# Patient Record
Sex: Male | Born: 1945 | Race: White | Hispanic: No | Marital: Married | Smoking: Former smoker
Health system: Southern US, Community
[De-identification: ages and names within clinical notes are randomized; demographics above are authoritative.]

## PROBLEM LIST (undated history)

## (undated) DIAGNOSIS — I519 Heart disease, unspecified: Secondary | ICD-10-CM

## (undated) DIAGNOSIS — E7800 Pure hypercholesterolemia, unspecified: Secondary | ICD-10-CM

## (undated) DIAGNOSIS — I10 Essential (primary) hypertension: Secondary | ICD-10-CM

## (undated) HISTORY — PX: CORONARY STENT PLACEMENT: SHX1402

## (undated) HISTORY — DX: Pure hypercholesterolemia, unspecified: E78.00

## (undated) HISTORY — DX: Heart disease, unspecified: I51.9

---

## 2015-09-19 ENCOUNTER — Emergency Department (HOSPITAL_COMMUNITY): Payer: Medicare PPO

## 2015-09-19 ENCOUNTER — Encounter (HOSPITAL_COMMUNITY): Payer: Self-pay | Admitting: Emergency Medicine

## 2015-09-19 ENCOUNTER — Observation Stay (HOSPITAL_COMMUNITY): Payer: Medicare PPO

## 2015-09-19 ENCOUNTER — Observation Stay (HOSPITAL_COMMUNITY)
Admission: EM | Admit: 2015-09-19 | Discharge: 2015-09-20 | Disposition: A | Discharge status: Home / Self Care | Payer: Medicare PPO | Attending: Internal Medicine | Admitting: Internal Medicine

## 2015-09-19 DIAGNOSIS — I6200 Nontraumatic subdural hemorrhage, unspecified: Principal | ICD-10-CM | POA: Insufficient documentation

## 2015-09-19 DIAGNOSIS — R262 Difficulty in walking, not elsewhere classified: Secondary | ICD-10-CM | POA: Diagnosis not present

## 2015-09-19 DIAGNOSIS — I2510 Atherosclerotic heart disease of native coronary artery without angina pectoris: Secondary | ICD-10-CM | POA: Diagnosis not present

## 2015-09-19 DIAGNOSIS — Z7982 Long term (current) use of aspirin: Secondary | ICD-10-CM | POA: Diagnosis not present

## 2015-09-19 DIAGNOSIS — D696 Thrombocytopenia, unspecified: Secondary | ICD-10-CM | POA: Diagnosis present

## 2015-09-19 DIAGNOSIS — R569 Unspecified convulsions: Secondary | ICD-10-CM

## 2015-09-19 DIAGNOSIS — G454 Transient global amnesia: Secondary | ICD-10-CM

## 2015-09-19 DIAGNOSIS — Z87891 Personal history of nicotine dependence: Secondary | ICD-10-CM | POA: Diagnosis not present

## 2015-09-19 DIAGNOSIS — S065XAA Traumatic subdural hemorrhage with loss of consciousness status unknown, initial encounter: Secondary | ICD-10-CM | POA: Insufficient documentation

## 2015-09-19 DIAGNOSIS — Y9321 Activity, ice skating: Secondary | ICD-10-CM | POA: Insufficient documentation

## 2015-09-19 DIAGNOSIS — Z955 Presence of coronary angioplasty implant and graft: Secondary | ICD-10-CM | POA: Diagnosis not present

## 2015-09-19 DIAGNOSIS — R413 Other amnesia: Secondary | ICD-10-CM | POA: Diagnosis present

## 2015-09-19 DIAGNOSIS — I10 Essential (primary) hypertension: Secondary | ICD-10-CM | POA: Insufficient documentation

## 2015-09-19 DIAGNOSIS — S065X9A Traumatic subdural hemorrhage with loss of consciousness of unspecified duration, initial encounter: Secondary | ICD-10-CM

## 2015-09-19 DIAGNOSIS — W19XXXA Unspecified fall, initial encounter: Secondary | ICD-10-CM

## 2015-09-19 DIAGNOSIS — E785 Hyperlipidemia, unspecified: Secondary | ICD-10-CM | POA: Insufficient documentation

## 2015-09-19 HISTORY — DX: Essential (primary) hypertension: I10

## 2015-09-19 LAB — URINALYSIS, ROUTINE W REFLEX MICROSCOPIC
Bilirubin Urine: NEGATIVE
Glucose, UA: NEGATIVE mg/dL
Hgb urine dipstick: NEGATIVE
KETONES UR: 15 mg/dL — AB
LEUKOCYTES UA: NEGATIVE
NITRITE: NEGATIVE
PH: 6 (ref 5.0–8.0)
Protein, ur: NEGATIVE mg/dL
Specific Gravity, Urine: 1.017 (ref 1.005–1.030)
UROBILINOGEN UA: 0.2 mg/dL (ref 0.0–1.0)

## 2015-09-19 LAB — RAPID URINE DRUG SCREEN, HOSP PERFORMED
Amphetamines: NOT DETECTED
BARBITURATES: NOT DETECTED
Benzodiazepines: NOT DETECTED
COCAINE: NOT DETECTED
OPIATES: NOT DETECTED
Tetrahydrocannabinol: NOT DETECTED

## 2015-09-19 LAB — CBC
HCT: 40.1 % (ref 39.0–52.0)
HEMOGLOBIN: 14.2 g/dL (ref 13.0–17.0)
MCH: 31.2 pg (ref 26.0–34.0)
MCHC: 35.4 g/dL (ref 30.0–36.0)
MCV: 88.1 fL (ref 78.0–100.0)
Platelets: 128 10*3/uL — ABNORMAL LOW (ref 150–400)
RBC: 4.55 MIL/uL (ref 4.22–5.81)
RDW: 12.7 % (ref 11.5–15.5)
WBC: 6.6 10*3/uL (ref 4.0–10.5)

## 2015-09-19 LAB — COMPREHENSIVE METABOLIC PANEL
ALT: 22 U/L (ref 17–63)
ANION GAP: 9 (ref 5–15)
AST: 31 U/L (ref 15–41)
Albumin: 4.1 g/dL (ref 3.5–5.0)
Alkaline Phosphatase: 44 U/L (ref 38–126)
BUN: 15 mg/dL (ref 6–20)
CHLORIDE: 102 mmol/L (ref 101–111)
CO2: 26 mmol/L (ref 22–32)
Calcium: 9 mg/dL (ref 8.9–10.3)
Creatinine, Ser: 0.91 mg/dL (ref 0.61–1.24)
GFR calc non Af Amer: >60 mL/min (ref >60)
Glucose, Bld: 91 mg/dL (ref 65–99)
Potassium: 3.8 mmol/L (ref 3.5–5.1)
SODIUM: 137 mmol/L (ref 135–145)
Total Bilirubin: 1.1 mg/dL (ref 0.3–1.2)
Total Protein: 6.4 g/dL — ABNORMAL LOW (ref 6.5–8.1)

## 2015-09-19 LAB — ETHANOL: Alcohol, Ethyl (B): <5 mg/dL (ref <5)

## 2015-09-19 LAB — I-STAT TROPONIN, ED: TROPONIN I, POC: 0 ng/mL (ref 0.00–0.08)

## 2015-09-19 LAB — PROTIME-INR
INR: 1.06 (ref 0.00–1.49)
Prothrombin Time: 14 seconds (ref 11.6–15.2)

## 2015-09-19 LAB — CBG MONITORING, ED: GLUCOSE-CAPILLARY: 85 mg/dL (ref 65–99)

## 2015-09-19 MED ORDER — DOXAZOSIN MESYLATE 8 MG PO TABS
4.0000 mg | ORAL_TABLET | Freq: Two times a day (BID) | ORAL | Status: DC
Start: 2015-09-20 — End: 2015-09-20
  Administered 2015-09-20: 4 mg via ORAL
  Filled 2015-09-19: qty 1

## 2015-09-19 MED ORDER — ACETAMINOPHEN 325 MG PO TABS: 650.0000 mg | ORAL_TABLET | ORAL | Status: DC | PRN

## 2015-09-19 MED ORDER — ATORVASTATIN CALCIUM 40 MG PO TABS
40.0000 mg | ORAL_TABLET | Freq: Every day | ORAL | Status: DC
  Administered 2015-09-19 – 2015-09-20 (×2): 40 mg via ORAL
  Filled 2015-09-19 (×2): qty 1

## 2015-09-19 MED ORDER — STROKE: EARLY STAGES OF RECOVERY BOOK
Freq: Once | Status: AC
  Administered 2015-09-19: 23:00:00
  Filled 2015-09-19: qty 1

## 2015-09-19 MED ORDER — FLUTICASONE PROPIONATE 50 MCG/ACT NA SUSP
2.0000 | Freq: Every day | NASAL | Status: DC
  Administered 2015-09-19 – 2015-09-20 (×2): 2 via NASAL
  Filled 2015-09-19: qty 16

## 2015-09-19 MED ORDER — LISINOPRIL-HYDROCHLOROTHIAZIDE 20-12.5 MG PO TABS
1.0000 | ORAL_TABLET | Freq: Every day | ORAL | Status: DC
  Filled 2015-09-19: qty 1

## 2015-09-19 MED ORDER — SENNOSIDES-DOCUSATE SODIUM 8.6-50 MG PO TABS
1.0000 | ORAL_TABLET | Freq: Two times a day (BID) | ORAL | Status: DC
  Administered 2015-09-19 – 2015-09-20 (×2): 1 via ORAL
  Filled 2015-09-19 (×2): qty 1

## 2015-09-19 MED ORDER — PANTOPRAZOLE SODIUM 40 MG IV SOLR
40.0000 mg | Freq: Every day | INTRAVENOUS | Status: DC
  Administered 2015-09-19: 40 mg via INTRAVENOUS
  Filled 2015-09-19: qty 40

## 2015-09-19 MED ORDER — ACETAMINOPHEN 650 MG RE SUPP: 650.0000 mg | RECTAL | Status: DC | PRN

## 2015-09-19 NOTE — ED Provider Notes (Signed)
CSN: [id number]     Arrival date & time 09/19/15  1648 History   First MD Initiated Contact with Patient 09/19/15 1723     Chief Complaint  Patient presents with  . Altered Mental Status     (Consider location/radiation/quality/duration/timing/severity/associated sxs/prior Treatment) HPI   Patient is a 69 year old male with history of  CAD presenting today with altered mental status. Patient doesn't remember at all what happened today. Wife helped fill in the blanks with the help of EMS run report.  Patient went to go ice skating she's been doing for the last month. Apparently he was confused and called his wife. She told him to go in and tells someone that he was confused. Reportedly he was found wandering around the parking lot of the ice-skating place.  Patient has no memory of what happened at all. Patient doesn't know what month it is. Patient is able to tell me his name and birthday. Patient aware of who his wife is.  Denies any trauma. Denies any ethanol denies any drug use. Denies any recent  medication changes.  Past Medical History  Diagnosis Date  . Hypertension    Past Surgical History  Procedure Laterality Date  . Coronary stent placement     History reviewed. No pertinent family history. Social History  Substance Use Topics  . Smoking status: Former Games developer  . Smokeless tobacco: Never Used  . Alcohol Use: Yes     Comment: occ    Review of Systems  Constitutional: Negative for fever and activity change.  HENT: Negative for drooling and hearing loss.   Eyes: Negative for discharge and redness.  Respiratory: Negative for cough and shortness of breath.   Cardiovascular: Negative for chest pain.  Gastrointestinal: Negative for abdominal pain.  Genitourinary: Negative for dysuria and urgency.  Musculoskeletal: Negative for arthralgias.  Allergic/Immunologic: Negative for immunocompromised state.  Neurological: Negative for dizziness, seizures, speech difficulty  and headaches.  Psychiatric/Behavioral: Positive for confusion.  All other systems reviewed and are negative.     Allergies  Review of patient's allergies indicates not on file.  Home Medications   Prior to Admission medications   Not on File   BP 125/72 mmHg  Pulse 75  Temp(Src) 98.1 F (36.7 C) (Oral)  Resp 12  SpO2 99% Physical Exam  Constitutional: He is oriented to person, place, and time. He appears well-nourished.  HENT:  Head: Normocephalic.  Mouth/Throat: Oropharynx is clear and moist.  Eyes: Conjunctivae are normal.  Neck: No tracheal deviation present.  Cardiovascular: Normal rate.   Pulmonary/Chest: Effort normal. No stridor. No respiratory distress.  Abdominal: Soft. There is no tenderness. There is no guarding.  Musculoskeletal: Normal range of motion. He exhibits no edema.  Neurological: He is oriented to person, place, and time. No cranial nerve deficit.  Patient has remote memory intact. However, remember anything from today. Cranial nerves otherwise normal. No pronator drift no other symptoms. His only neurologic symptoms inability to remember the month today.  Skin: Skin is warm and dry. No rash noted. He is not diaphoretic.  Psychiatric: He has a normal mood and affect. His behavior is normal.  Nursing note and vitals reviewed.   ED Course  Procedures (including critical care time) Labs Review Labs Reviewed  CBC  COMPREHENSIVE METABOLIC PANEL  PROTIME-INR  ETHANOL  URINE RAPID DRUG SCREEN, HOSP PERFORMED  URINALYSIS, ROUTINE W REFLEX MICROSCOPIC (NOT AT [facility])  CBG MONITORING, ED  I-STAT TROPOININ, ED    Imaging Review No results found.  I have personally reviewed and evaluated these images and lab results as part of my medical decision-making.   EKG Interpretation   Date/Time:  Thursday September 19 2015 17:01:37 EDT Ventricular Rate:  71 PR Interval:  190 QRS Duration: 97 QT Interval:  396 QTC Calculation: 430 R Axis:   7 Text  Interpretation:  Sinus rhythm RSR' in V1 or V2, probably normal  variant Borderline T abnormalities, inferior leads Minimal ST elevation,  anterior leads no acute ischemia T wave inversion in V3 Confirmed by  [place], COURTNEY ([id number]) on 09/19/2015 5:30:24 PM      MDM   Final diagnoses:  None     patient is a 69 year old male presenting with altered mental status. Patient seems to have isolated recent memory loss. He had confusion and no memories from the entire day. We'll get CT brain to make sure he does not have hemorrhagic bleed. Will get ethanol and drug screen.  Concern for TGA versus ischemic stroke versus hemorrhagic stroke worse Versus med effect.  We'll get CT head, labs and consult neurology.  6:39 PM Neurology agrees, likely TGA will need inpatient MRI and EEG with neuro following.     Courteney Randall An, MD 09/19/15 [phone]

## 2015-09-19 NOTE — ED Notes (Signed)
Pt continues to have memory issues.  Pt able to recall his favorite activity (ice hockey), but still unable to recall the month, which this RN has given to him twice since his arrival.  Pt knows what year, but does not feel that it is "late enough in the year" for it to be September.

## 2015-09-19 NOTE — ED Notes (Signed)
Followed up with CT for ETA.  States patient is "on the list"

## 2015-09-19 NOTE — H&P (Signed)
[facility] History and Physical  Patient: Dennis Short  MRN: [id number]  DOB: 1946/04/09  DOS: the patient was seen and examined on 09/19/2015 PCP: Dennis Kidney, MD  Referring physician: Dr. Elza Rafter Chief Complaint: Confusion  HPI: Dennis Short is a 69 y.o. male with Past medical history of coronary artery disease, essential hypertension, dyslipidemia, thrombocytopenia. The patient is presenting with complaints of confusion. Patient was found by EMS as he was seen by staff at an ice rink fumbling. Patient is unable to remember any events. Patient denies having any complaints of headache, dizziness, lightheadedness, vertigo, vision changes, speech changes, nausea, vomiting, abdominal pain, chest pain, diarrhea, constipation, burning urination, focal deficit. He denies any changes in his medication. He denies having any fall trauma or injury today. He mentions he had a fall a week ago. He denies having any alcohol or drugs. Patient did have a PCI in [place]. [facility] with drug-eluting stent in unspecified location.  The patient is coming from home.  At his baseline ambulates without support And is independent for most of his ADL; manages his medication on his own.  Review of Systems: as mentioned in the history of present illness.  A comprehensive review of the other systems is negative.  Past Medical History  Diagnosis Date  . Hypertension    Past Surgical History  Procedure Laterality Date  . Coronary stent placement     Social History:  reports that he has quit smoking. He has never used smokeless tobacco. He reports that he drinks alcohol. He reports that he does not use illicit drugs.  Allergies  Allergen Reactions  . Codeine Other (See Comments)    'made me feel bad'    History reviewed. No pertinent family history.  Prior to Admission medications   Medication Sig Start Date End Date Taking? Authorizing Provider  aspirin EC 81 MG tablet Take 81  mg by mouth daily.   Yes Historical Provider, MD  atorvastatin (LIPITOR) 40 MG tablet Take 40 mg by mouth daily. 07/02/15  Yes Historical Provider, MD  doxazosin (CARDURA) 4 MG tablet Take 4 mg by mouth 2 (two) times daily. 07/11/15  Yes Historical Provider, MD  EFFIENT 10 MG TABS tablet Take 10 mg by mouth daily. 07/12/15  Yes Historical Provider, MD  fluticasone (FLONASE) 50 MCG/ACT nasal spray 2 SPRAYS EACH NOSTRIL IN THE MORNING 08/19/15  Yes Historical Provider, MD  KLOR-CON M10 10 MEQ tablet Take 10 mEq by mouth 2 (two) times daily. 06/17/15  Yes Historical Provider, MD  lisinopril-hydrochlorothiazide (PRINZIDE,ZESTORETIC) 20-12.5 MG tablet Take 1 tablet by mouth daily. 07/01/15  Yes Historical Provider, MD  NITROSTAT 0.4 MG SL tablet PLACE ONE TABLET (0.4 MG TOTAL) UNDER THE TONGUE EVERY 5 (FIVE) MINUTES AS NEEDED FOR CHEST PAIN. 06/25/15  Yes Historical Provider, MD    Physical Exam: Filed Vitals:   09/19/15 2005 09/19/15 2015 09/19/15 2030 09/19/15 2100  BP:  123/64 118/58 130/70  Pulse:  70 68 63  Temp: 97.9 F (36.6 C)     TempSrc:      Resp:  14 11   SpO2:  99% 99% 99%    General: Alert, Awake and Oriented to Time, Place and Person. Appear in mild distress Eyes: PERRL ENT: Oral Mucosa clear moist Neck: no JVD, possible lipoma on the back of the neck Cardiovascular: S1 and S2 Present, no Murmur, Peripheral Pulses Present Respiratory: Bilateral Air entry equal and Decreased,  Clear to Auscultation, no Crackles, no wheezes Abdomen: Bowel Sound present, Soft  and no tenderness Skin: no Rash Extremities: no Pedal edema, no calf tenderness Neurologic: Grossly no focal neuro deficit other than memory  Labs on Admission:  CBC:  Recent Labs Lab 09/19/15 1811  WBC 6.6  HGB 14.2  HCT 40.1  MCV 88.1  PLT 128*    CMP     Component Value Date/Time   NA 137 09/19/2015 1811   K 3.8 09/19/2015 1811   CL 102 09/19/2015 1811   CO2 26 09/19/2015 1811   GLUCOSE 91 09/19/2015 1811     BUN 15 09/19/2015 1811   CREATININE 0.91 09/19/2015 1811   CALCIUM 9.0 09/19/2015 1811   PROT 6.4* 09/19/2015 1811   ALBUMIN 4.1 09/19/2015 1811   AST 31 09/19/2015 1811   ALT 22 09/19/2015 1811   ALKPHOS 44 09/19/2015 1811   BILITOT 1.1 09/19/2015 1811   GFRNONAA >60 09/19/2015 1811   GFRAA >60 09/19/2015 1811    No results for input(s): CKTOTAL, CKMB, CKMBINDEX, TROPONINI in the last 168 hours. BNP (last 3 results) No results for input(s): BNP in the last 8760 hours.  ProBNP (last 3 results) No results for input(s): PROBNP in the last 8760 hours.   Radiological Exams on Admission: Ct Head Wo Contrast  09/19/2015   CLINICAL DATA:  Recent fall and altered mental status  EXAM: CT HEAD WITHOUT CONTRAST  TECHNIQUE: Contiguous axial images were obtained from the base of the skull through the vertex without intravenous contrast.  COMPARISON:  None.  FINDINGS: Bony calvarium is intact. Mild thickening of the falx is noted. This would be consistent with a small falx subdural hematoma. No subarachnoid hemorrhage is identified at this time. No acute infarct is seen.  IMPRESSION: Mild subdural hematoma along the falx cerebri predominately to the left of the midline. No significant mass-effect is seen. No other focal abnormality is noted.  Critical Value/emergent results were called by telephone at the time of interpretation on 09/19/2015 at 7:36 pm to Dr. Bary Castilla , who verbally acknowledged these results.   Electronically Signed   By: Alcide Clever M.D.   On: 09/19/2015 19:35   Ct Cervical Spine Wo Contrast  09/19/2015   CLINICAL DATA:  Fall today with posterior neck stiffness. Amnesia. Initial encounter.  EXAM: CT CERVICAL SPINE WITHOUT CONTRAST  TECHNIQUE: Multidetector CT imaging of the cervical spine was performed without intravenous contrast. Multiplanar CT image reconstructions were also generated.  COMPARISON:  Head CT 09/19/2015.  FINDINGS: The cervical alignment is normal. There  is no evidence of acute fracture or traumatic subluxation.  There is multilevel spondylosis with disc space loss and uncinate spurring greatest at C5-6 and C6-7. There is mild resulting foraminal narrowing.  No acute soft tissue findings are demonstrated within the neck. There is a subcutaneous lipoma posteriorly in the upper right neck, measuring up to 4 cm transverse. Carotid calcifications are present bilaterally. There is a calcified granuloma at the left lung apex.  IMPRESSION: 1. No evidence of acute cervical spine fracture, traumatic subluxation or static signs of instability. 2. Cervical spondylosis. 3. Subcutaneous lipoma posteriorly in the upper right neck.   Electronically Signed   By: Carey Bullocks M.D.   On: 09/19/2015 21:27   EKG: Independently reviewed. normal sinus rhythm, nonspecific ST and T waves changes.  Assessment/Plan 1. SDH (subdural hematoma)  2 Amnesia The patient is presenting with complaints of confusion. CT scan is positive for mild subdural hematoma without any mass effect. Neurology has a winter the patient and recommend an  EEG as well as MRI brain. At present we do not have any information whether the patient had a head injury or not. Currently holding aspirin and Effient, patient has already taken the dose for 09/19/2015. Patient will need a cardiology consultation in the morning to discuss further regarding continuation of aspirin and Effient. Neuro-surgery has been consulted by ER who recommends conservative management.  3  CAD (coronary artery disease), PCI June 2016 Xience, [facility] [place] Patient did have recent coronary artery intervention, and is on dual antiplatelets therapy. Will need cardiology consultation regarding continuation of dual antiplatelets In the setting of ongoing subdural hematoma.  4  Essential hypertension Continue home medications.  5  Dyslipidemia Continue home medication.  6  Thrombocytopenia etiology unclear  but I do not have any prior values to compare. Currently we will closely monitor.  Nutrition: Regular cardiac diet  DVT Prophylaxis:mechanical compression device  Advance goals of care discussion: Full code   Consults: Neurology neurosurgery  Family Communication: family was present at bedside, opportunity was given to ask question and all questions were answered satisfactorily at the time of interview. Disposition: Admitted as observation, telemetry unit.  Author: Lynden Oxford, MD [facility] Pager: [phone] 09/19/2015  If 7PM-7AM, please contact night-coverage [email or web address] Password [email or web address]

## 2015-09-19 NOTE — ED Notes (Signed)
Attempted report x1. 

## 2015-09-19 NOTE — Consult Note (Signed)
Neurology Consultation Reason for Consult: Memory problems Referring Physician: Corlis Leak, C  CC: Memory problems  History is obtained from: Patient, wife  HPI: Dennis Short is a 69 y.o. male with a history of hypertension who presents with memory problems started around noon. He went to the ice rink to do some skating, and while there he called his wife. His wife states that he was saying the same thing over and over again, and was telling her that something was not right.  The last person that he had seen was her this morning, and he had been fine at that time. She states that currently he seems like his normal self, except for the fact that he doesn't remember a significant portion of the afternoon. He states that more recent periods he is able to remember with some degree of haziness.  When asked about previous episodes, the wife relates a single episode when he was running 5K and became slightly confused and agitated though he improved after hydration. He did not have any problems with his memory during that episode.  LKW: 6 AM tpa given?: no, out of window    ROS: A 14 point ROS was performed and is negative except as noted in the HPI.   Past Medical History  Diagnosis Date  . Hypertension     Social History:  reports that he has quit smoking. He has never used smokeless tobacco. He reports that he drinks alcohol. He reports that he does not use illicit drugs.   Exam: Current vital signs: BP 125/72 mmHg  Pulse 75  Temp(Src) 98.1 F (36.7 C) (Oral)  Resp 12  SpO2 99% Vital signs in last 24 hours: Temp:  [98.1 F (36.7 C)] 98.1 F (36.7 C) (09/29 1656) Pulse Rate:  [75] 75 (09/29 1656) Resp:  [12] 12 (09/29 1656) BP: (125)/(72) 125/72 mmHg (09/29 1656) SpO2:  [98 %-99 %] 99 % (09/29 1656)   Physical Exam  Constitutional: Appears well-developed and well-nourished.  Psych: Affect appropriate to situation Eyes: No scleral injection HENT: No OP obstrucion Head:  Normocephalic.  Cardiovascular: Normal rate and regular rhythm.  Respiratory: Effort normal and breath sounds normal to anterior ascultation GI: Soft.  No distension. There is no tenderness.  Skin: WDI  Neuro: Mental Status: Patient is awake, alert, oriented to person, place, month, year, and situation. Patient is able to give a clear and coherent history. No signs of aphasia or neglect He has 3/3 registration He is able to remember 1/3 objects, but is able to recall the other 2 with prompting He is able to perform serial sevens and world backwards without difficulty Cranial Nerves: II: Visual Fields are full. Pupils are equal, round, and reactive to light.   III,IV, VI: EOMI without ptosis or diploplia.  V: Facial sensation is symmetric to temperature VII: Facial movement is symmetric.  VIII: hearing is intact to voice X: Uvula elevates symmetrically XI: Shoulder shrug is symmetric. XII: tongue is midline without atrophy or fasciculations.  Motor: Tone is normal. Bulk is normal. 5/5 strength was present in all four extremities.  Sensory: Sensation is symmetric to light touch and temperature in the arms and legs. Deep Tendon Reflexes: 2+ and symmetric in the biceps and patellae.  Plantars: Toes are downgoing bilaterally.  Cerebellar: FNF  are intact bilaterally   I have reviewed labs in epic and the results pertinent to this consultation are: CMP-unremarkable CBC-unremarkable  Impression: 69 year old male with new onset memory difficulty around noon today. My suspicion is  that this represents transient global amnesia. I did not get to examine him during the most characteristic phase of it, however, and I do think that further evaluation is necessary at this time. Another possibility would be partial seizure which could also explain his previous episode.  Recommendations: 1) MRI brain 2) EEG 3) further recommendations following these tests.    Ritta Slot,  MD [facility] [phone]  If 7pm- 7am, please page neurology on call as listed in AMION.

## 2015-09-19 NOTE — ED Notes (Signed)
Per EMS:  Pt went to the ice rink today around 12:30.  After the event pt was seen by staff fumbling in the parking lot to get in to his car.  Pt had an episode similar to this in July where patient was found to have a 90% blockage.  Pt denies any pain at this time, and is alert and oriented, but is having memory impairment and task recollection issues.  Pt does not recall EMS arriving.

## 2015-09-20 ENCOUNTER — Observation Stay (HOSPITAL_BASED_OUTPATIENT_CLINIC_OR_DEPARTMENT_OTHER)
Admit: 2015-09-20 | Discharge: 2015-09-20 | Disposition: A | Discharge status: Home / Self Care | Payer: Medicare PPO | Attending: Internal Medicine | Admitting: Internal Medicine

## 2015-09-20 DIAGNOSIS — I25118 Atherosclerotic heart disease of native coronary artery with other forms of angina pectoris: Secondary | ICD-10-CM | POA: Diagnosis not present

## 2015-09-20 DIAGNOSIS — R569 Unspecified convulsions: Secondary | ICD-10-CM

## 2015-09-20 DIAGNOSIS — I2510 Atherosclerotic heart disease of native coronary artery without angina pectoris: Secondary | ICD-10-CM

## 2015-09-20 DIAGNOSIS — I6200 Nontraumatic subdural hemorrhage, unspecified: Secondary | ICD-10-CM

## 2015-09-20 DIAGNOSIS — E785 Hyperlipidemia, unspecified: Secondary | ICD-10-CM | POA: Diagnosis not present

## 2015-09-20 DIAGNOSIS — R410 Disorientation, unspecified: Secondary | ICD-10-CM

## 2015-09-20 DIAGNOSIS — R413 Other amnesia: Secondary | ICD-10-CM | POA: Diagnosis not present

## 2015-09-20 DIAGNOSIS — Z955 Presence of coronary angioplasty implant and graft: Secondary | ICD-10-CM

## 2015-09-20 LAB — CBC WITH DIFFERENTIAL/PLATELET
BASOS PCT: 0 %
Basophils Absolute: 0 10*3/uL (ref 0.0–0.1)
Eosinophils Absolute: 0.2 10*3/uL (ref 0.0–0.7)
Eosinophils Relative: 3 %
HEMATOCRIT: 39.8 % (ref 39.0–52.0)
HEMOGLOBIN: 13.7 g/dL (ref 13.0–17.0)
LYMPHS ABS: 1.3 10*3/uL (ref 0.7–4.0)
Lymphocytes Relative: 24 %
MCH: 30.9 pg (ref 26.0–34.0)
MCHC: 34.4 g/dL (ref 30.0–36.0)
MCV: 89.6 fL (ref 78.0–100.0)
MONO ABS: 0.6 10*3/uL (ref 0.1–1.0)
MONOS PCT: 10 %
NEUTROS ABS: 3.3 10*3/uL (ref 1.7–7.7)
NEUTROS PCT: 63 %
Platelets: 121 10*3/uL — ABNORMAL LOW (ref 150–400)
RBC: 4.44 MIL/uL (ref 4.22–5.81)
RDW: 12.9 % (ref 11.5–15.5)
WBC: 5.4 10*3/uL (ref 4.0–10.5)

## 2015-09-20 LAB — COMPREHENSIVE METABOLIC PANEL
ALK PHOS: 41 U/L (ref 38–126)
ALT: 20 U/L (ref 17–63)
ANION GAP: 9 (ref 5–15)
AST: 26 U/L (ref 15–41)
Albumin: 3.8 g/dL (ref 3.5–5.0)
BILIRUBIN TOTAL: 1 mg/dL (ref 0.3–1.2)
BUN: 14 mg/dL (ref 6–20)
CALCIUM: 9.2 mg/dL (ref 8.9–10.3)
CO2: 28 mmol/L (ref 22–32)
Chloride: 102 mmol/L (ref 101–111)
Creatinine, Ser: 0.93 mg/dL (ref 0.61–1.24)
GLUCOSE: 88 mg/dL (ref 65–99)
POTASSIUM: 3.5 mmol/L (ref 3.5–5.1)
Sodium: 139 mmol/L (ref 135–145)
TOTAL PROTEIN: 5.9 g/dL — AB (ref 6.5–8.1)

## 2015-09-20 LAB — PROTIME-INR
INR: 1.01 (ref 0.00–1.49)
PROTHROMBIN TIME: 13.5 s (ref 11.6–15.2)

## 2015-09-20 MED ORDER — ASPIRIN 81 MG PO CHEW
81.0000 mg | CHEWABLE_TABLET | Freq: Every day | ORAL | Status: DC
  Administered 2015-09-20: 81 mg via ORAL
  Filled 2015-09-20: qty 1

## 2015-09-20 MED ORDER — CLOPIDOGREL BISULFATE 75 MG PO TABS: 75.0000 mg | ORAL_TABLET | Freq: Every day | ORAL | Status: DC

## 2015-09-20 MED ORDER — PANTOPRAZOLE SODIUM 40 MG PO TBEC: 40.0000 mg | DELAYED_RELEASE_TABLET | Freq: Every day | ORAL | Status: DC

## 2015-09-20 MED ORDER — CLOPIDOGREL BISULFATE 75 MG PO TABS
75.0000 mg | ORAL_TABLET | Freq: Every day | ORAL | Status: DC
  Administered 2015-09-20: 75 mg via ORAL
  Filled 2015-09-20: qty 1

## 2015-09-20 MED ORDER — HYDROCHLOROTHIAZIDE 12.5 MG PO CAPS
12.5000 mg | ORAL_CAPSULE | Freq: Every day | ORAL | Status: DC
  Administered 2015-09-20: 12.5 mg via ORAL
  Filled 2015-09-20: qty 1

## 2015-09-20 MED ORDER — LEVETIRACETAM 500 MG PO TABS: 500.0000 mg | ORAL_TABLET | Freq: Two times a day (BID) | ORAL | Status: DC

## 2015-09-20 MED ORDER — LISINOPRIL 20 MG PO TABS
20.0000 mg | ORAL_TABLET | Freq: Every day | ORAL | Status: DC
  Administered 2015-09-20: 20 mg via ORAL
  Filled 2015-09-20: qty 1

## 2015-09-20 MED ORDER — LEVETIRACETAM 500 MG PO TABS
500.0000 mg | ORAL_TABLET | Freq: Two times a day (BID) | ORAL | Status: DC
  Administered 2015-09-20: 500 mg via ORAL
  Filled 2015-09-20: qty 1

## 2015-09-20 NOTE — Discharge Instructions (Signed)
Follow with Primary MD  Stanton Kidney, MD  and other consultant as instructed your Hospitalist MD  You had possible Seizure Please do not drive, operate heavy machinery, participate in activities at heights or participate in high speed sports until you have seen by Primary MD or a Neurologist and advised to do so again.   Get Medicines reviewed and adjusted. Please take all your medications with you for your next visit with your Primary MD  Please request your Primary MD to go over all hospital tests and procedure/radiological results at the follow up, please ask your Primary MD to get all Hospital records sent to his/her office.  If you experience worsening of your admission symptoms, develop shortness of breath, life threatening emergency, suicidal or homicidal thoughts you must seek medical attention immediately by calling 911 or calling your MD immediately  if symptoms less severe.  You must read complete instructions/literature along with all the possible adverse reactions/side effects for all the Medicines you take and that have been prescribed to you. Take any new Medicines after you have completely understood and accpet all the possible adverse reactions/side effects.   Do not drive when taking Pain medications.   Do not take more than prescribed Pain, Sleep and Anxiety Medications  Special Instructions: If you have smoked or chewed Tobacco  in the last 2 yrs please stop smoking, stop any regular Alcohol  and or any Recreational drug use.  Wear Seat belts while driving.  Please note  You were cared for by a hospitalist during your hospital stay. Once you are discharged, your primary care physician will handle any further medical issues. Please note that NO REFILLS for any discharge medications will be authorized once you are discharged, as it is imperative that you return to your primary care physician (or establish a relationship with a primary care physician if you do not have one)  for your aftercare needs so that they can reassess your need for medications and monitor your lab values.

## 2015-09-20 NOTE — Progress Notes (Addendum)
Called by Dr. Jerral Ralph regarding Dennis Short's recent head trauma.  Apparently he has a small subdural hematoma.  Per our conversation, he is spoken with the neurosurgeon Dr. Dutch Quint who feels that resumption of dual antiplatelets therapy is acceptable.  Dennis Short had a Xience DES placed in [place] in June of this year.  He will require 1 year of DAPT.  Since he has almost had 6 months since stent placement, this is typically enough time for adequate endothelialization although current guidelines recommend one year of treatment.  It is our group's practice to recommend switching Effient to Plavix, as it is clearly less potent but acceptable therapy.  He should continue aspirin as well.  It may be reasonable to consider checking a P2Y12 platelet assay after 5 doses of plavix to ensure adequate platelet inhibition (<220 is acceptable).  I'm happy to see him in the office if he needs local follow-up, otherwise, follow-up with his cardiologist after discharge.  Dennis Nose, MD, [facility] Attending Cardiologist [facility]

## 2015-09-20 NOTE — Progress Notes (Addendum)
Subjective: No complaints. Walking hallway with PT.   Exam: Filed Vitals:   09/20/15 0900  BP: 126/62  Pulse: 58  Temp: 98.2 F (36.8 C)  Resp: 18    HEENT-  Normocephalic, no lesions, without obvious abnormality.  Normal external eye and conjunctiva.  Normal TM's bilaterally.  Normal auditory canals and external ears. Normal external nose, mucus membranes and septum.  Normal pharynx. Cardiovascular- S1, S2 normal, pulses palpable throughout   Lungs- chest clear, no wheezing, rales, normal symmetric air entry Abdomen- normal findings: bowel sounds normal Extremities- no edema     Gen: In bed, NAD MS: Alert and oriented follows all commands. Speech clear with no aphasia.  CN: PERRLA, EOMI, TML, Face symmetrical.  Motor: Moving all extremities Sensory: grossly intact DTR: 2+ throughout  Pertinent Labs: PLT 121  Felicie Morn PA-C [facility] [phone]  I have seen and evaluated the patient. I have reviewed the above note and made appropriate changes.    I have reviewed the RMI which shows a small subdural.   Impression: 69 YO male with new onset confusion and newly found left SDH.  Given this, I suspec that tthe patient likely had a complex partial seizure yesterday resulting in the confusional state. As I said in my note, at least by the time of my exam, the exam was not characteristic of TGA and with the structural findign on imaging, I think that seizure is much more likely. I would favor starting anti-epiletic therapy at this time. SDH likely happened after fall two weeks ago.    Recommendations: 1) Keppra  BID 2) Patient is unable to drive, operate heavy machinery, perform activities at heights or participate in water activities until release by outpatient physician. This was discussed with the patient who expressed understanding.  3) I have requested outpatient referral with neurology.    Ritta Slot, MD [facility] [phone]  If 7pm- 7am, please page neurology on call as listed in AMION. 09/20/2015, 10:42 AM

## 2015-09-20 NOTE — Evaluation (Signed)
Occupational Therapy Evaluation Patient Details Name: Dennis Short MRN: [id number] DOB: [birth year] Today's Date: 09/20/2015    History of Present Illness pt presents with episode of confusion and was found to have small L Parafalcine SDH and with hx of HTN.     Clinical Impression   Pt demonstrating independence in ADL and mobility.  Administered MOCA with score of 26/30, within normal range. Errors made in memory and sequencing/executive functioning. Educated pt in memory strategies and possible scenarios when pt may experience difficulty with pt verbalizing understanding.    Follow Up Recommendations  No OT follow up    Equipment Recommendations  None recommended by OT    Recommendations for Other Services       Precautions / Restrictions Precautions Precautions: None Restrictions Weight Bearing Restrictions: No      Mobility Bed Mobility Overal bed mobility: Independent                Transfers Overall transfer level: Independent Equipment used: None                  Balance Overall balance assessment: Independent                                          ADL Overall ADL's : Independent                                             Vision     Perception     Praxis      Pertinent Vitals/Pain Pain Assessment: No/denies pain     Hand Dominance Left   Extremity/Trunk Assessment Upper Extremity Assessment Upper Extremity Assessment: Overall WFL for tasks assessed   Lower Extremity Assessment Lower Extremity Assessment: Overall WFL for tasks assessed   Cervical / Trunk Assessment Cervical / Trunk Assessment: Normal   Communication Communication Communication: No difficulties   Cognition Arousal/Alertness: Awake/alert Behavior During Therapy: WFL for tasks assessed/performed Overall Cognitive Status: Impaired/Different from baseline Area of Impairment: Memory     Memory: Decreased short-term  memory   Safety/Judgement: Decreased awareness of deficits   Problem Solving: Slow processing;Difficulty sequencing General Comments: Scored 26/30 on MOCA with impairment in memory and sequencing, but score falls in the normal limit.   General Comments       Exercises       Shoulder Instructions      Home Living Family/patient expects to be discharged to:: Private residence Living Arrangements: Spouse/significant other Available Help at Discharge: Family;Available PRN/intermittently Type of Home: House Home Access: Stairs to enter [employer] of Steps: 10 Entrance Stairs-Rails: None Home Layout: Multi-level Alternate Level Stairs-Number of Steps: 3 Alternate Level Stairs-Rails: Right Bathroom Shower/Tub: Producer, television/film/video: Standard     Home Equipment: None   Additional Comments: Wife works as a Runner, broadcasting/film/video.      Prior Functioning/Environment Level of Independence: Independent             OT Diagnosis:     OT Problem List:     OT Treatment/Interventions:      OT Goals(Current goals can be found in the care plan section) Acute Rehab OT Goals Patient Stated Goal: return to playing hockey  OT Frequency:     Barriers to D/C:  Co-evaluation              End of Session    Activity Tolerance: Patient tolerated treatment well Patient left: in bed;with call bell/phone within reach   Time: 1110-1135 OT Time Calculation (min): 25 min Charges:  OT General Charges $OT Visit: 1 Procedure OT Evaluation $Initial OT Evaluation Tier I: 1 Procedure OT Treatments $Cognitive Skills Development: 8-22 mins G-Codes: OT G-codes **NOT FOR INPATIENT CLASS** Functional Assessment Tool Used: clincal judgement Functional Limitation: Self care Self Care Current Status (O9629): 0 percent impaired, limited or restricted Self Care Goal Status (B2841): 0 percent impaired, limited or restricted Self Care Discharge Status (L2440): 0  percent impaired, limited or restricted  Evern Bio 09/20/2015, 12:01 PM [phone]

## 2015-09-20 NOTE — Progress Notes (Signed)
EEG completed, results pending. 

## 2015-09-20 NOTE — Progress Notes (Signed)
Pt discharging at this time with wife alert, verbal taking all personal belongings. IV discontinued, dry dressing applied. Discharge instructions and prescriptions provided with verbal understanding. Pt denies pain or discomfort. No noted distress. Follow up appt scheduled.

## 2015-09-20 NOTE — Evaluation (Signed)
Physical Therapy Evaluation Patient Details Name: Dennis Short MRN: [id number] DOB: [birth year] Today's Date: 09/20/2015   History of Present Illness  pt presents with episode of confusion and was found to have small L Parafalcine SDH and with hx of HTN.    Clinical Impression  Pt moving well and with no deficits.  Pt with mild cognitive difficulties and pt indicates he normally manages the bills at home.  Discussed with pt allowing wife to manage bills at this time and that pt would not be able to drive until cleared by MD.  No further acute PT needs at this time, will sign off.      Follow Up Recommendations No PT follow up;Supervision - Intermittent    Equipment Recommendations  None recommended by PT    Recommendations for Other Services       Precautions / Restrictions Precautions Precautions: None Restrictions Weight Bearing Restrictions: No      Mobility  Bed Mobility Overal bed mobility: Independent                Transfers Overall transfer level: Independent Equipment used: None                Ambulation/Gait Ambulation/Gait assistance: Independent Ambulation Distance (Feet): 140 Feet Assistive device: None Gait Pattern/deviations: WFL(Within Functional Limits)        Stairs Stairs: Yes Stairs assistance: Independent Stair Management: No rails;Alternating pattern;Forwards Number of Stairs: 11    Wheelchair Mobility    Modified Rankin (Stroke Patients Only) Modified Rankin (Stroke Patients Only) Pre-Morbid Rankin Score: No symptoms Modified Rankin: No significant disability     Balance Overall balance assessment: Independent                                           Pertinent Vitals/Pain Pain Assessment: No/denies pain    Home Living Family/patient expects to be discharged to:: Private residence Living Arrangements: Spouse/significant other Available Help at Discharge: Family;Available  PRN/intermittently Type of Home: House Home Access: Stairs to enter Entrance Stairs-Rails: None Entrance Stairs-Number of Steps: 10 Home Layout: Multi-level Home Equipment: None Additional Comments: Wife works as a Runner, broadcasting/film/video.    Prior Function Level of Independence: Independent               Hand Dominance        Extremity/Trunk Assessment   Upper Extremity Assessment: Defer to OT evaluation           Lower Extremity Assessment: Overall WFL for tasks assessed      Cervical / Trunk Assessment: Normal  Communication   Communication: No difficulties  Cognition Arousal/Alertness: Awake/alert Behavior During Therapy: WFL for tasks assessed/performed Overall Cognitive Status: Impaired/Different from baseline Area of Impairment: Memory;Problem solving;Safety/judgement     Memory: Decreased short-term memory   Safety/Judgement: Decreased awareness of deficits   Problem Solving: Slow processing;Difficulty sequencing General Comments: pt seems somewhat absent minded and had mild difficulty following multi step directions while ambulating.  pt also had difficulty with counting backwards by 7's while walking.      General Comments      Exercises        Assessment/Plan    PT Assessment Patent does not need any further PT services  PT Diagnosis Difficulty walking   PT Problem List    PT Treatment Interventions     PT Goals (Current goals can be found in the Care  Plan section) Acute Rehab PT Goals PT Goal Formulation: All assessment and education complete, DC therapy    Frequency     Barriers to discharge        Co-evaluation               End of Session   Activity Tolerance: Patient tolerated treatment well Patient left: in bed;with call bell/phone within reach Nurse Communication: Mobility status    Functional Assessment Tool Used: Clinical Judgement Functional Limitation: Mobility: Walking and moving around Mobility: Walking and Moving  Around Current Status (Z6109): 0 percent impaired, limited or restricted Mobility: Walking and Moving Around Goal Status [phone]): 0 percent impaired, limited or restricted Mobility: Walking and Moving Around Discharge Status [phone]): 0 percent impaired, limited or restricted    Time: 1025-1050 PT Time Calculation (min) (ACUTE ONLY): 25 min   Charges:   PT Evaluation $Initial PT Evaluation Tier I: 1 Procedure     PT G Codes:   PT G-Codes **NOT FOR INPATIENT CLASS** Functional Assessment Tool Used: Clinical Judgement Functional Limitation: Mobility: Walking and moving around Mobility: Walking and Moving Around Current Status (B1478): 0 percent impaired, limited or restricted Mobility: Walking and Moving Around Goal Status (G9562): 0 percent impaired, limited or restricted Mobility: Walking and Moving Around Discharge Status (Z3086): 0 percent impaired, limited or restricted    Sunny Schlein, [place] [phone] 09/20/2015, 11:39 AM

## 2015-09-20 NOTE — Evaluation (Addendum)
Speech Language Pathology Evaluation Patient Details Name: Dennis Short MRN: [id number] DOB: 03/20/1946 Today's Date: 09/20/2015 Time: 1345-1401 SLP Time Calculation (min) (ACUTE ONLY): 16 min  Problem List:  Patient Active Problem List   Diagnosis Date Noted  . TGA (transient global amnesia) 10/01/2015  . S/P drug eluting coronary stent placement 09/20/2015  . Subdural hematoma (HCC) 09/19/2015  . SDH (subdural hematoma) (HCC) 09/19/2015  . Amnesia 09/19/2015  . CAD (coronary artery disease), PCI June 2016 [facility], [place] 09/19/2015  . Essential hypertension 09/19/2015  . Dyslipidemia 09/19/2015  . Thrombocytopenia (HCC) 09/19/2015   Past Medical History:  Past Medical History  Diagnosis Date  . Hypertension   . High cholesterol   . Heart disease    Past Surgical History:  Past Surgical History  Procedure Laterality Date  . Coronary stent placement     HPI:  69 year old man with confusion. CT scan showed mild subdural hematoma without any mass effect.   Assessment / Plan / Recommendation Clinical Impression   Pt presents with grossly intact cognitive-linguistic function.  Pt was able to name objects in room and follow multi-unit commands for 100% accuracy.  No focal oral motor deficits were noted upon informal exam and pt's speech was clear, fluent and free of dysarthria and/or word finding deficits.  Pt was mod I for functional problem solving and abstract reasoning during tasks assessed and he was able to recall 4 unrelated words after a 5 minute delay independently.  Pt and wife both endorse return to cognitive baseline.  As a result, no further ST needs are indicated at this time.    SLP Assessment  Patient does not need any further Speech Lanaguage Pathology Services          Pertinent Vitals/Pain Pain Assessment: No/denies pain       SLP Evaluation Prior Functioning  Cognitive/Linguistic Baseline: Within functional limits Type of  Home: House  Lives With: Spouse Available Help at Discharge: Family;Available PRN/intermittently   Cognition  Overall Cognitive Status: Within Functional Limits for tasks assessed Arousal/Alertness: Awake/alert Orientation Level: Oriented X4 Attention: Selective Selective Attention: Appears intact Memory: Appears intact Awareness: Appears intact Problem Solving: Appears intact Safety/Judgment: Appears intact    Comprehension  Auditory Comprehension Overall Auditory Comprehension: Appears within functional limits for tasks assessed    Expression Expression Primary Mode of Expression: Verbal Verbal Expression Overall Verbal Expression: Appears within functional limits for tasks assessed Written Expression Dominant Hand: Left   Oral / Motor Oral Motor/Sensory Function Overall Oral Motor/Sensory Function: Appears within functional limits for tasks assessed Motor Speech Overall Motor Speech: Appears within functional limits for tasks assessed   GO    G Code Memory Evaluation tool used: skilled clinical judgement Current level- CH 0% impaired Goal level- CH 0% impaired Discharge status- CH 0% impaired    Page, Melanee Spry 10/02/2015, 1:24 PM

## 2015-09-20 NOTE — Care Management Note (Signed)
Case Management Note  Patient Details  Name: Dennis Short MRN: [id number] Date of Birth: [birth year]  Subjective/Objective:                    Action/Plan: Patient admitted with SDH. Pt is from home with his wife. Awaiting PT/OT recommendations for discharge disposition. CM will continue to follow for discharge needs.   Expected Discharge Date:                  Expected Discharge Plan:  Home/Self Care  In-House Referral:     Discharge planning Services     Post Acute Care Choice:    Choice offered to:     DME Arranged:    DME Agency:     HH Arranged:    HH Agency:     Status of Service:  In process, will continue to follow  Medicare Important Message Given:    Date Medicare IM Given:    Medicare IM give by:    Date Additional Medicare IM Given:    Additional Medicare Important Message give by:     If discussed at Long Length of Stay Meetings, dates discussed:    Additional Comments:  Kermit Balo, RN 09/20/2015, 11:00 AM

## 2015-09-20 NOTE — Procedures (Signed)
ELECTROENCEPHALOGRAM REPORT  Date of Study: 09/20/2015  Patient's Name: Dennis Short MRN: [id number] Date of Birth: 11-13-1946  Referring Provider: Dr. Lynden Oxford  Clinical History: This is a 69 year old man with confusion. CT scan showed mild subdural hematoma without any mass effect.  Medications: aspirin chewable tablet 81 mg atorvastatin (LIPITOR) tablet 40 mg clopidogrel (PLAVIX) tablet 75 mg doxazosin (CARDURA) tablet 4 mg fluticasone (FLONASE) 50 MCG/ACT nasal spray 2 spray hydrochlorothiazide (MICROZIDE) capsule 12.5 mg lisinopril (PRINIVIL,ZESTRIL) tablet 20 mg pantoprazole (PROTONIX) EC tablet 40 mg  Technical Summary: A multichannel digital EEG recording measured by the international 10-20 system with electrodes applied with paste and impedances below 5000 ohms performed in our laboratory with EKG monitoring in an awake and drowsy patient.  Hyperventilation and photic stimulation were performed.  The digital EEG was referentially recorded, reformatted, and digitally filtered in a variety of bipolar and referential montages for optimal display.    Description: The patient is awake and drowsy during the recording.  During maximal wakefulness, there is a symmetric, medium voltage 10.5 Hz posterior dominant rhythm that attenuates with eye opening.  The record is symmetric.  During drowsiness, there is an increase in theta slowing of the background, with shifting asymmetry over the bilateral temporal regions. Deeper stages of sleep were not seen. There were no epileptiform discharges or electrographic seizures seen.    EKG lead showed sinus bradycardia.  Impression: This awake and drowsy EEG is within normal limits for age.  Clinical Correlation: A normal EEG does not exclude a clinical diagnosis of epilepsy.  Clinical correlation is advised.   Patrcia Dolly, M.D.

## 2015-09-20 NOTE — Discharge Summary (Signed)
PATIENT DETAILS Name: Dennis Short Age: 69 y.o. Sex: male Date of Birth: 1946-01-08 MRN: [id number]. Admitting Physician: Rolly Salter, MD MVH:QIONG,EXBMWUX, MD  Admit Date: 09/19/2015 Discharge date: 09/20/2015  Recommendations for Outpatient Follow-up:  1.  New Medication:Keppra and Plavix 2. Effient discontinued-see below  PRIMARY DISCHARGE DIAGNOSIS:  Principal Problem:   SDH (subdural hematoma) Active Problems:   Amnesia   CAD (coronary artery disease), PCI June 2016 [facility], [facility], [place]   Essential hypertension   Dyslipidemia   Thrombocytopenia   S/P drug eluting coronary stent placement      PAST MEDICAL HISTORY: Past Medical History  Diagnosis Date  . Hypertension     DISCHARGE MEDICATIONS: Current Discharge Medication List    START taking these medications   Details  clopidogrel (PLAVIX) 75 MG tablet Take 1 tablet (75 mg total) by mouth daily. Qty: 90 tablet, Refills: 0    levETIRAcetam (KEPPRA) 500 MG tablet Take 1 tablet (500 mg total) by mouth 2 (two) times daily. Qty: 120 tablet, Refills: 0      CONTINUE these medications which have NOT CHANGED   Details  aspirin EC 81 MG tablet Take 81 mg by mouth daily.    atorvastatin (LIPITOR) 40 MG tablet Take 40 mg by mouth daily. Refills: 3    doxazosin (CARDURA) 4 MG tablet Take 4 mg by mouth 2 (two) times daily. Refills: 5    fluticasone (FLONASE) 50 MCG/ACT nasal spray 2 SPRAYS EACH NOSTRIL IN THE MORNING Refills: 1    KLOR-CON M10 10 MEQ tablet Take 10 mEq by mouth 2 (two) times daily. Refills: 5    lisinopril-hydrochlorothiazide (PRINZIDE,ZESTORETIC) 20-12.5 MG tablet Take 1 tablet by mouth daily. Refills: 3    NITROSTAT 0.4 MG SL tablet PLACE ONE TABLET (0.4 MG TOTAL) UNDER THE TONGUE EVERY 5 (FIVE) MINUTES AS NEEDED FOR CHEST PAIN. Refills: 3      STOP taking these medications     EFFIENT 10 MG TABS tablet         ALLERGIES:   Allergies  Allergen  Reactions  . Codeine Other (See Comments)    'made me feel bad'    BRIEF HPI:  See H&P, Labs, Consult and Test reports for all details in brief, patient was admitted for evaluation of confusion with amnesia  CONSULTATIONS:   neurology  PERTINENT RADIOLOGIC STUDIES: Ct Head Wo Contrast  09/19/2015   CLINICAL DATA:  Recent fall and altered mental status  EXAM: CT HEAD WITHOUT CONTRAST  TECHNIQUE: Contiguous axial images were obtained from the base of the skull through the vertex without intravenous contrast.  COMPARISON:  None.  FINDINGS: Bony calvarium is intact. Mild thickening of the falx is noted. This would be consistent with a small falx subdural hematoma. No subarachnoid hemorrhage is identified at this time. No acute infarct is seen.  IMPRESSION: Mild subdural hematoma along the falx cerebri predominately to the left of the midline. No significant mass-effect is seen. No other focal abnormality is noted.  Critical Value/emergent results were called by telephone at the time of interpretation on 09/19/2015 at 7:36 pm to Dr. Bary Castilla , who verbally acknowledged these results.   Electronically Signed   By: Alcide Clever M.D.   On: 09/19/2015 19:35   Ct Cervical Spine Wo Contrast  09/19/2015   CLINICAL DATA:  Fall today with posterior neck stiffness. Amnesia. Initial encounter.  EXAM: CT CERVICAL SPINE WITHOUT CONTRAST  TECHNIQUE: Multidetector CT imaging of the cervical spine was performed  without intravenous contrast. Multiplanar CT image reconstructions were also generated.  COMPARISON:  Head CT 09/19/2015.  FINDINGS: The cervical alignment is normal. There is no evidence of acute fracture or traumatic subluxation.  There is multilevel spondylosis with disc space loss and uncinate spurring greatest at C5-6 and C6-7. There is mild resulting foraminal narrowing.  No acute soft tissue findings are demonstrated within the neck. There is a subcutaneous lipoma posteriorly in the upper right  neck, measuring up to 4 cm transverse. Carotid calcifications are present bilaterally. There is a calcified granuloma at the left lung apex.  IMPRESSION: 1. No evidence of acute cervical spine fracture, traumatic subluxation or static signs of instability. 2. Cervical spondylosis. 3. Subcutaneous lipoma posteriorly in the upper right neck.   Electronically Signed   By: Carey Bullocks M.D.   On: 09/19/2015 21:27   Mr Brain Wo Contrast  09/20/2015   CLINICAL DATA:  Memory difficulties beginning at noon. Symptoms began while ice skating. History of hypertension. Followup subdural hematoma.  EXAM: MRI HEAD WITHOUT CONTRAST  TECHNIQUE: Multiplanar, multiecho pulse sequences of the brain and surrounding structures were obtained without intravenous contrast.  COMPARISON:  CT head September 19, 2015 at 1919 hours  FINDINGS: Ventricles and sulci are normal for patient's age. No reduced diffusion to suggest acute ischemia. Susceptibility artifact along LEFT falx corresponding to known subdural hematoma which measures up to 3 mm without mass effect. No susceptibility artifact to suggest intraparenchymal hemorrhage. Patchy supratentorial white matter T2 hyperintensities. No midline shift, mass effect or mass lesions.  Major intracranial vascular flow voids observed at the skull base. Trace paranasal sinus mucosal thickening without air-fluid levels. The mastoid air cells are well aerated. Ocular globes and orbital contents are normal. No abnormal sellar expansion. No cerebellar tonsillar ectopia. RIGHT suboccipital partially imaged lipoma measures at least 22 x 37 mm.  IMPRESSION: Small LEFT parafalcine subdural hematoma without mass effect.  Involutional changes. Mild to moderate white matter changes compatible with chronic small vessel ischemic disease.   Electronically Signed   By: Awilda Metro M.D.   On: 09/20/2015 00:58     PERTINENT LAB RESULTS: CBC:  Recent Labs  09/19/15 1811 09/20/15 0448  WBC 6.6  5.4  HGB 14.2 13.7  HCT 40.1 39.8  PLT 128* 121*   CMET CMP     Component Value Date/Time   NA 139 09/20/2015 0448   K 3.5 09/20/2015 0448   CL 102 09/20/2015 0448   CO2 28 09/20/2015 0448   GLUCOSE 88 09/20/2015 0448   BUN 14 09/20/2015 0448   CREATININE 0.93 09/20/2015 0448   CALCIUM 9.2 09/20/2015 0448   PROT 5.9* 09/20/2015 0448   ALBUMIN 3.8 09/20/2015 0448   AST 26 09/20/2015 0448   ALT 20 09/20/2015 0448   ALKPHOS 41 09/20/2015 0448   BILITOT 1.0 09/20/2015 0448   GFRNONAA >60 09/20/2015 0448   GFRAA >60 09/20/2015 0448    GFR Estimated Creatinine Clearance: 75 mL/min (by C-G formula based on Cr of 0.93). No results for input(s): LIPASE, AMYLASE in the last 72 hours. No results for input(s): CKTOTAL, CKMB, CKMBINDEX, TROPONINI in the last 72 hours. Invalid input(s): POCBNP No results for input(s): DDIMER in the last 72 hours. No results for input(s): HGBA1C in the last 72 hours. No results for input(s): CHOL, HDL, LDLCALC, TRIG, CHOLHDL, LDLDIRECT in the last 72 hours. No results for input(s): TSH, T4TOTAL, T3FREE, THYROIDAB in the last 72 hours.  Invalid input(s): FREET3 No results for input(s):  VITAMINB12, FOLATE, FERRITIN, TIBC, IRON, RETICCTPCT in the last 72 hours. Coags:  Recent Labs  09/19/15 1811  INR 1.06   Microbiology: No results found for this or any previous visit (from the past 240 hour(s)).   BRIEF HOSPITAL COURSE:   Principal Problem: Transient global amnesia: Current suspicion for a complex partial seizure resulting in amnesia/confusion. CT head showed a small subdural hematoma, which was also confirmed on MRI brain. Neurology recommending Keppra needed patient was asked not to drive, operative heavy machinery, participating in activities at [place].  Active Problems:  SDH (subdural hematoma): Likely secondary to a fall that the patient sustained 2 weeks prior to this admission (ice skating). This M.D. spoke with neurosurgery on call-Dr.  Jordan Likes over the phone-who reviewed the images-and felt that in his experience he had never seen this type of subdural hematoma enlarge, and felt that continuing dual anti-platelet agents was acceptable. This M.D. then subsequently spoke with cardiology-Dr. Fredonia Highland discussion, recommendations are that we switch to Plavix and discontinue Effient. Patient was asked to follow-up with his cardiologist in [place]-to check a P2Y12 platelet assay. Patient also was advised not to ice skate till Cleared by PCP.  CAD (coronary artery disease), PCI June 2016 [facility], [facility], [place]: No chest pain or shortness of breath. See above regarding antiplatelets agents. Continue usual medications.  Essential hypertension: Stable, continue Doxil Zosyn, lisinopril and HCTZ  Dyslipidemia: Continue statin  Thrombocytopenia: Mild, follow with PCP-on dual antiplatelets-will need is monitoring as an outpatient  TODAY-DAY OF DISCHARGE:  Subjective:   Dennis Short today has no headache,no chest abdominal pain,no new weakness tingling or numbness, feels much better wants to go home today.   Objective:   Blood pressure 126/62, pulse 58, temperature 98.2 F (36.8 C), temperature source Oral, resp. rate 18, height  (1.753 m), weight 79.379 kg (175 lb), SpO2 98 %.  Intake/Output Summary (Last 24 hours) at 09/20/15 1104 Last data filed at 09/20/15 1008  Gross per 24 hour  Intake    240 ml  Output      0 ml  Net    240 ml   Filed Weights   09/19/15 2200  Weight: 79.379 kg (175 lb)    Exam Awake Alert, Oriented *3, No new F.N deficits, Normal affect [place].AT,PERRAL Supple Neck,No JVD, No cervical lymphadenopathy appriciated.  Symmetrical Chest wall movement, Good air movement bilaterally, CTAB RRR,No Gallops,Rubs or new Murmurs, No Parasternal Heave +ve B.Sounds, Abd Soft, Non tender, No organomegaly appriciated, No rebound -guarding or rigidity. No Cyanosis, Clubbing or edema, No new Rash  or bruise  DISCHARGE CONDITION: Stable  DISPOSITION: Home  DISCHARGE INSTRUCTIONS:    Activity:  As tolerated   Get Medicines reviewed and adjusted: Please take all your medications with you for your next visit with your Primary MD  Please request your Primary MD to go over all hospital tests and procedure/radiological results at the follow up, please ask your Primary MD to get all Hospital records sent to his/her office.  If you experience worsening of your admission symptoms, develop shortness of breath, life threatening emergency, suicidal or homicidal thoughts you must seek medical attention immediately by calling 911 or calling your MD immediately  if symptoms less severe.  You must read complete instructions/literature along with all the possible adverse reactions/side effects for all the Medicines you take and that have been prescribed to you. Take any new Medicines after you have completely understood and accpet all the possible adverse reactions/side effects.  Do not drive when taking Pain medications.   Do not take more than prescribed Pain, Sleep and Anxiety Medications  Special Instructions: If you have smoked or chewed Tobacco  in the last 2 yrs please stop smoking, stop any regular Alcohol  and or any Recreational drug use.  Wear Seat belts while driving.  Please note  You were cared for by a hospitalist during your hospital stay. Once you are discharged, your primary care physician will handle any further medical issues. Please note that NO REFILLS for any discharge medications will be authorized once you are discharged, as it is imperative that you return to your primary care physician (or establish a relationship with a primary care physician if you do not have one) for your aftercare needs so that they can reassess your need for medications and monitor your lab values.   Diet recommendation: Heart Healthy diet  Discharge Instructions    Ambulatory referral to  Neurology    Complete by:  As directed   An appointment is requested in approximately: 2 weeks     Diet - low sodium heart healthy    Complete by:  As directed      Discharge instructions    Complete by:  As directed   Please do not drive, operate heavy machinery, participate in activities at heights or participate in high speed sports until you have seen by Primary MD or a Neurologist and advised to do so again.     Increase activity slowly    Complete by:  As directed            Follow-up Information    Follow up with [facility], MD. Schedule an appointment as soon as possible for a visit in 2 weeks.   Specialty:  Family Medicine   Contact information:   [address] [place] [address] [phone]       Follow up with Elana Alm, MD. Schedule an appointment as soon as possible for a visit in 1 week.   Specialty:  Internal Medicine   Why:  for post hospital discharge-please ask to check a P2Y12 platelet assay after 5 doses of plavix to ensure adequate platelet inhibition    Contact information:   [address] [place] [address] [phone]       Total Time spent on discharge equals 45 minutes. SignedJeoffrey Massed 09/20/2015 11:04 AM

## 2015-10-01 ENCOUNTER — Ambulatory Visit (INDEPENDENT_AMBULATORY_CARE_PROVIDER_SITE_OTHER): Payer: Medicare PPO | Admitting: Neurology

## 2015-10-01 ENCOUNTER — Encounter: Payer: Self-pay | Admitting: Neurology

## 2015-10-01 VITALS — BP 139/78 | HR 48 | Ht 69.0 in | Wt 180.6 lb

## 2015-10-01 DIAGNOSIS — I6200 Nontraumatic subdural hemorrhage, unspecified: Secondary | ICD-10-CM

## 2015-10-01 DIAGNOSIS — G454 Transient global amnesia: Secondary | ICD-10-CM | POA: Diagnosis not present

## 2015-10-01 DIAGNOSIS — S065XAA Traumatic subdural hemorrhage with loss of consciousness status unknown, initial encounter: Secondary | ICD-10-CM

## 2015-10-01 DIAGNOSIS — S065X9A Traumatic subdural hemorrhage with loss of consciousness of unspecified duration, initial encounter: Secondary | ICD-10-CM

## 2015-10-01 DIAGNOSIS — R404 Transient alteration of awareness: Secondary | ICD-10-CM

## 2015-10-01 MED ORDER — LEVETIRACETAM 500 MG PO TABS: 500.0000 mg | ORAL_TABLET | Freq: Two times a day (BID) | ORAL | Status: DC

## 2015-10-01 NOTE — Progress Notes (Addendum)
[facility]    Provider:  Dr Lucia Gaskins Referring Provider: Stanton Kidney, MD Primary Care Physician:  Stanton Kidney, MD  CC:  seizure  HPI:  Dennis Short is a 69 y.o. male with Past medical history of coronary artery disease( PCI June 2016 [facility], [facility], [place] , essential hypertension, dyslipidemia, thrombocytopenia who presented to [facility] with confusion after a fall. Per a review of records, he was confused and disoriented and found by EMS. Patient did not remember any events. He had a fall a week previous to the incident.  A left SDH was found on imaging. He was continued on his dual platelet therapy for his recent stent. He is following with cardiology.. EEG was normal however it was thought he suffered a complex partial seizure vs TGA. He was started on Keppra. CBC and CMP unremarkable.   Here with his wife who provides most information of the incident. The last thing patient remembers he was in his car, he had left the rink, changed clothes and he remembers talking to his wife on the phone. He doesn't remember the 2 hours prior to that. Apparently he was skating and put on some hockey equipment and was shooting a puck and skating around. In the car, his wife noticed he was repeating the same thing over and over again, the same question even though she would tell him. Wife says he was answering questions and she was answering but asking the same ones over and over again.  Wife thought something was wrong, asked him to go back into the rink and ask people for help. EMS came. The rink emplotyees later said he was pacing for a while and he looked confused. No history of seizures personally or in the family. 2 weeks earlier he fell on the ice and his head just barely and he had a helmet on too. No confusion or headache or anything after the fall. He did not feel tired after the incident, he just couldn't recall anything during  the time frame. In the ED he  was completely fine.   Reviewed notes, labs and imaging from outside physicians, which showed:  Cbc with plts 121, cmp unremarkable  Personally reviewed MRI images and agree with following: Small LEFT parafalcine subdural hematoma without mass effect. Involutional changes. Mild to moderate white matter changes compatible with chronic small vessel ischemic disease.   Review of Systems: Patient complains of symptoms per HPI as well as the following symptoms: easy bruising, easy bleeding. Pertinent negatives per HPI. All others negative.   Social History   Social History  . Marital Status: Married    Spouse Name: Dennis Short  . Number of Children: 2  . Years of Education: 12   Occupational History  . Retired     Social History Main Topics  . Smoking status: Former Smoker    Quit date: 12/21/1969  . Smokeless tobacco: Never Used  . Alcohol Use: 0.0 oz/week    0 Standard drinks or equivalent per week     Comment: occass  . Drug Use: No  . Sexual Activity: Not on file   Other Topics Concern  . Not on file   Social History Narrative   Lives at home with [name].   Caffeine: Drinks coffee daily    Family History  Problem Relation Age of Onset  . Seizures Neg Hx     Past Medical History  Diagnosis Date  . Hypertension   . High cholesterol   .  Heart disease     Past Surgical History  Procedure Laterality Date  . Coronary stent placement      Current Outpatient Prescriptions  Medication Sig Dispense Refill  . aspirin EC 81 MG tablet Take 81 mg by mouth daily.    Marland Kitchen atorvastatin (LIPITOR) 40 MG tablet Take 40 mg by mouth daily.  3  . clopidogrel (PLAVIX) 75 MG tablet Take 1 tablet (75 mg total) by mouth daily. 90 tablet 0  . doxazosin (CARDURA) 4 MG tablet Take 4 mg by mouth 2 (two) times daily.  5  . fluticasone (FLONASE) 50 MCG/ACT nasal spray 2 SPRAYS EACH NOSTRIL IN THE MORNING  1  . KLOR-CON M10 10 MEQ tablet Take 10 mEq by mouth 2 (two) times  daily.  5  . levETIRAcetam (KEPPRA) 500 MG tablet Take 1 tablet (500 mg total) by mouth 2 (two) times daily. 60 tablet 6  . NITROSTAT 0.4 MG SL tablet PLACE ONE TABLET (0.4 MG TOTAL) UNDER THE TONGUE EVERY 5 (FIVE) MINUTES AS NEEDED FOR CHEST PAIN.  3  . sildenafil (VIAGRA) 100 MG tablet Take 100 mg by mouth as needed.    Marland Kitchen lisinopril-hydrochlorothiazide (PRINZIDE,ZESTORETIC) 20-12.5 MG tablet Take 1 tablet by mouth daily.  3   No current facility-administered medications for this visit.    Allergies as of 10/01/2015 - Review Complete 10/01/2015  Allergen Reaction Noted  . Codeine Other (See Comments) 09/19/2015    Vitals: BP 139/78 mmHg  Pulse 48  Ht  (1.753 m)  Wt 180 lb 9.6 oz (81.92 kg)  BMI 26.66 kg/m2 Last Weight:  Wt Readings from Last 1 Encounters:  10/01/15 180 lb 9.6 oz (81.92 kg)   Last Height:   Ht Readings from Last 1 Encounters:  10/01/15  (1.753 m)    Physical exam: Exam: Gen: NAD, conversant, well nourised, obese, well groomed                     CV: RRR, no MRG. No Carotid Bruits. No peripheral edema, warm, nontender Eyes: Conjunctivae clear without exudates or hemorrhage  Neuro: Detailed Neurologic Exam  Speech:    Speech is normal; fluent and spontaneous with normal comprehension.  Cognition: Montreal Cognitive Assessment  10/01/2015  Visuospatial/ Executive (0/5) 5  Naming (0/3) 3  Attention: Read list of digits (0/2) 2  Attention: Read list of letters (0/1) 1  Attention: Serial 7 subtraction starting at 100 (0/3) 2  Language: Repeat phrase (0/2) 1  Language : Fluency (0/1) 1  Abstraction (0/2) 2  Delayed Recall (0/5) 3  Orientation (0/6) 6  Total 26  Adjusted Score (based on education) 26       The patient is oriented to person, place, and time;     recent and remote memory intact;     language fluent;     normal attention, concentration,     fund of knowledge Cranial Nerves:    The pupils are equal, round, and reactive to  light. The fundi are normal and spontaneous venous pulsations are present. Visual fields are full to finger confrontation. Extraocular movements are intact. Trigeminal sensation is intact and the muscles of mastication are normal. The face is symmetric. The palate elevates in the midline. Hearing intact. Voice is normal. Shoulder shrug is normal. The tongue has normal motion without fasciculations.   Coordination:    Normal finger to nose and heel to shin. Normal rapid alternating movements.   Gait:    Heel-toe and  tandem gait are normal.   Motor Observation:    No asymmetry, no atrophy, and no involuntary movements noted. Tone:    Normal muscle tone.    Posture:    Posture is normal. normal erect    Strength:    Strength is V/V in the upper and lower limbs.      Sensation: intact to LT     Reflex Exam:  DTR's:    Deep tendon reflexes in the upper and lower extremities are normal bilaterally.   Toes:    The toes are downgoing bilaterally.   Clonus:    Clonus is absent.      Assessment/Plan:  69 year old gentleman with an episode of transient alteration of awareness. Episode more c/w transient global amnesia however cannot rule out partial seizure especially given SDH from fall 2 weeks earlier. Advised if this is TGA there is no need to refrain from driving, recurrence is very low. However cannot rule out seizure which would require him not to drive for 6 months. Will repeat EEG and CT of the head. Will continue on Keppra at this time.   Naomie Dean, MD  [facility] [address] [place], [place] [address]  Phone [phone] Fax [phone]

## 2015-10-01 NOTE — Patient Instructions (Signed)
Remember to drink plenty of fluid, eat healthy meals and do not skip any meals. Try to eat protein with a every meal and eat a healthy snack such as fruit or nuts in between meals. Try to keep a regular sleep-wake schedule and try to exercise daily, particularly in the form of walking, 20-30 minutes a day, if you can.   As far as your medications are concerned, I would like to suggest: Continue Keppra for 3 months  As far as diagnostic testing: CT of the head and EEG  I would like to see you back in 3 months, sooner if we need to. Please call us with any interim questions, concerns, problems, updates or refill requests.   Please also call us for any test results so we can go over those with you on the phone.  My clinical assistant and will answer any of your questions and relay your messages to me and also relay most of my messages to you.   Our phone number is [phone]. We also have an after hours call service for urgent matters and there is a physician on-call for urgent questions. For any emergencies you know to call 911 or go to the nearest emergency room

## 2015-10-08 ENCOUNTER — Ambulatory Visit
Admission: RE | Admit: 2015-10-08 | Discharge: 2015-10-08 | Disposition: A | Discharge status: Home / Self Care | Payer: Medicare PPO | Source: Ambulatory Visit | Attending: Neurology | Admitting: Neurology

## 2015-10-08 DIAGNOSIS — S065XAA Traumatic subdural hemorrhage with loss of consciousness status unknown, initial encounter: Secondary | ICD-10-CM

## 2015-10-08 DIAGNOSIS — S065X9A Traumatic subdural hemorrhage with loss of consciousness of unspecified duration, initial encounter: Secondary | ICD-10-CM

## 2015-10-08 DIAGNOSIS — I6200 Nontraumatic subdural hemorrhage, unspecified: Secondary | ICD-10-CM | POA: Diagnosis not present

## 2015-10-09 ENCOUNTER — Telehealth: Payer: Self-pay | Admitting: *Deleted

## 2015-10-09 NOTE — Telephone Encounter (Signed)
Called and spoke to pt about CT head. Advised per Dr. Lucia GaskinsAhern it shows improvement, there is still a little leftover blood, but it is improving. He verbalized understanding. Verified he is having EEG on 11/7 and I will call him once those results are ready.

## 2015-10-09 NOTE — Telephone Encounter (Signed)
-----   Message from Anson FretAntonia B Ahern, MD sent at 10/09/2015  9:18 AM EDT ----- Please let patient know the CT scan of his head shows improvement, there is still a litte leftover blood but it is improving

## 2015-10-28 ENCOUNTER — Other Ambulatory Visit: Payer: Medicare PPO

## 2015-10-28 ENCOUNTER — Telehealth: Payer: Self-pay | Admitting: Neurology

## 2015-10-28 NOTE — Telephone Encounter (Signed)
Called pt back. He stated he had previously spoke to Dr Lucia GaskinsAhern about the Irritability as a side effect from keppra. But the last week, he has noticed it has gotten worse. He said he has done things he normally does not do. For instance, on Saturday, he drank more than he probably should have. He doesn't drink hardly anything normally. He thought he seemed okay, so he went ahead and drank. He knows better not to drink, especially on a medication on this. He is not sure why he did it. His wife helped take care of him. He said it seems like his decision making skills are not as good. He did notice the irritability was worse. He recognizes this. He knows he needs to calm down when this happens.Today, it was hard to think and reason. Finally, around 3:00 pm, he feels fine and normal. Sometimes the feeling last longer than other times. He thinks it is medication related. I told him I will speak with Dr Lucia GaskinsAhern and call him back to advise. He verbalized understanding.

## 2015-10-28 NOTE — Telephone Encounter (Signed)
Patient called to advise side effects of levETIRAcetam (KEPPRA) 500 MG tablet have "gotten a lot worse"." Feels terrible, I'm concerned about it".

## 2015-10-28 NOTE — Telephone Encounter (Signed)
Called pt back. Advised per Dr Lucia GaskinsAhern that she suggest stopping the Keppra. His EEG was negative, repeat CT head was improved. Also questioned whether it really was a seizure. If he has another episode, call us and we can put him back on medication. He is comfortable with this and will call us if anything changes. He has EEG on 11/14 at 12pm.

## 2015-10-28 NOTE — Telephone Encounter (Signed)
Thank you :)

## 2015-10-28 NOTE — Telephone Encounter (Signed)
Dennis Short, I suggest just stopping the Keppra. His EEG was negative and the repeat CT of the head was improved. We also questioned whether this was really a seizure to begin with. If he has another episode, we can put him back on medication. Ask how he feels about this, thanks

## 2015-11-04 ENCOUNTER — Other Ambulatory Visit: Payer: Medicare PPO

## 2015-11-04 ENCOUNTER — Ambulatory Visit (INDEPENDENT_AMBULATORY_CARE_PROVIDER_SITE_OTHER): Payer: Medicare PPO | Admitting: Neurology

## 2015-11-04 DIAGNOSIS — R404 Transient alteration of awareness: Secondary | ICD-10-CM | POA: Diagnosis not present

## 2015-11-05 ENCOUNTER — Telehealth: Payer: Self-pay | Admitting: *Deleted

## 2015-11-05 NOTE — Telephone Encounter (Signed)
Called and spoke to pt. Relayed Dr Lucia GaskinsAhern message that d/t [place] law he cannot drive until he is episode free for 6 months. Pt verbalized understanding.

## 2015-11-05 NOTE — Telephone Encounter (Signed)
Kara MeadEmma, per [place] law he still has to make sure he does not have anymore episodes for 6 months. Sorry.

## 2015-11-05 NOTE — Telephone Encounter (Signed)
Called pt and spoke to him about normal EEG. Pt verbalized understanding but would like to know next steps. He wants to know if he can resume day to day activities/driving. I advised Dr Lucia GaskinsAhern out of office and I will send her the message and call him back. He is ok with this.

## 2015-11-05 NOTE — Procedures (Signed)
ELECTROENCEPHALOGRAM REPORT  Date of Study: 11/04/2015  Patient's Name: Dennis Short MRN: [id number] Date of Birth: 1946-07-22  Referring Provider: Dr. Naomie DeanAntonia Ahern  Clinical History: This is a 69 year old man with an episode of transient altered awareness, global amnesia.  Medications: ASA, Lipitor, Plavix, Flonase, Klor-Con, Nitrostat, Cardura, Viagra, Lisinopril HCTZ  Technical Summary: A multichannel digital EEG recording measured by the international 10-20 system with electrodes applied with paste and impedances below 5000 ohms performed in our laboratory with EKG monitoring in an awake and asleep patient.  Hyperventilation and photic stimulation were performed.  The digital EEG was referentially recorded, reformatted, and digitally filtered in a variety of bipolar and referential montages for optimal display.    Description: The patient is awake and asleep during the recording.  During maximal wakefulness, there is a symmetric, medium voltage 10 Hz posterior dominant rhythm that attenuates with eye opening.  The record is symmetric.  During drowsiness and sleep, there is an increase in theta slowing of the background.  Vertex waves and symmetric sleep spindles were seen.  Hyperventilation and photic stimulation did not elicit any abnormalities.  There were no epileptiform discharges or electrographic seizures seen.    EKG lead showed sinus bradycardia at 60 bpm.  Impression: This awake and asleep EEG is normal.    Clinical Correlation: A normal EEG does not exclude a clinical diagnosis of epilepsy. Clinical correlation is advised.   Patrcia DollyKaren Aquino, M.D.

## 2015-11-05 NOTE — Telephone Encounter (Signed)
-----   Message from Anson FretAntonia B Ahern, MD sent at 11/05/2015  9:12 AM EST ----- Normal EEG thanks

## 2016-01-01 ENCOUNTER — Encounter: Payer: Self-pay | Admitting: Neurology

## 2016-01-01 ENCOUNTER — Ambulatory Visit (INDEPENDENT_AMBULATORY_CARE_PROVIDER_SITE_OTHER): Payer: Medicare Other | Admitting: Neurology

## 2016-01-01 VITALS — BP 162/78 | HR 57 | Wt 178.6 lb

## 2016-01-01 DIAGNOSIS — G454 Transient global amnesia: Secondary | ICD-10-CM

## 2016-01-01 NOTE — Progress Notes (Signed)
[id number]    Provider: Dr Lucia GaskinsAhern Referring Provider: [name], [name], MD Primary Care Physician: [name],[name], MD    CC: seizure  Interval history 01/04/2016:  Not on Keppra. No events. 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 Not likely to happen again. Cleared to drive. No AEDs needed.  HPI: Dennis Short is a 70 y.o. male with Past medical history of coronary artery disease( PCI June 2016 [facility] [place] , essential hypertension, dyslipidemia, thrombocytopenia who [facility] with confusion after a fall. Per a review of records, he was confused and disoriented and found by EMS. Patient did not remember any events. He had a fall a week previous to the incident. A left SDH was found on imaging. He was continued on his dual platelet therapy for his recent stent. He is following with cardiology.. EEG was normal however it was thought he suffered a complex partial seizure vs TGA. He was started on Keppra. CBC and CMP unremarkable.   Here with his wife who provides most information of the incident. The last thing patient remembers he was in his car, he had left the rink, changed clothes and he remembers talking to his wife on the phone. He doesn't remember the 2 hours prior to that. Apparently he was skating and put on some hockey equipment and was shooting a puck and skating around. In the car, his wife noticed he was repeating the same thing over and over again, the same question even though she would tell him. Wife says he was answering questions and she was answering but asking the same ones over and over again. Wife thought something was wrong, asked him to go back into the rink and ask people for help. EMS came. The rink emplotyees later said he was pacing for a while and he looked confused. No history of seizures personally or in the family. 2 weeks earlier he fell on the ice and his head just barely and he  had a helmet on too. No confusion or headache or anything after the fall. He did not feel tired after the incident, he just couldn't recall anything during the time frame. In the ED he was completely fine.   Reviewed notes, labs and imaging from outside physicians, which showed:  Cbc with plts 121, cmp unremarkable  Personally reviewed MRI images and agree with following: Small LEFT parafalcine subdural hematoma without mass effect. Involutional changes. Mild to moderate white matter changes compatible with chronic small vessel ischemic disease.   Review of Systems: Patient complains of symptoms per HPI as well as the following symptoms: easy bruising, easy bleeding. Pertinent negatives per HPI. All others negative.   Social History   Social History  . Marital Status: Married    Spouse Name: Dennis GerlachLeigh Ann  . Number of Children: 2  . Years of Education: 12   Occupational History  . Retired     Social History Main Topics  . Smoking status: Former Smoker    Quit date: 12/21/1969  . Smokeless tobacco: Never Used  . Alcohol Use: 0.0 oz/week    0 Standard drinks or equivalent per week     Comment: occass  . Drug Use: No  . Sexual Activity: Not on file   Other Topics Concern  . Not on file   Social History Narrative   Lives at home with Dennis Short.   Caffeine: Drinks coffee daily    Family History  Problem Relation Age of Onset  .  Seizures Neg Hx     Past Medical History  Diagnosis Date  . Hypertension   . High cholesterol   . Heart disease     Past Surgical History  Procedure Laterality Date  . Coronary stent placement      Current Outpatient Prescriptions  Medication Sig Dispense Refill  . aspirin EC 81 MG tablet Take 81 mg by mouth daily.    Marland Kitchen atorvastatin (LIPITOR) 40 MG tablet Take 40 mg by mouth daily.  3  . clopidogrel (PLAVIX) 75 MG tablet Take 1 tablet (75 mg total) by mouth daily. 90 tablet 0  . doxazosin (CARDURA) 4 MG tablet Take 4 mg by  mouth 2 (two) times daily.  5  . fluticasone (FLONASE) 50 MCG/ACT nasal spray 2 SPRAYS EACH NOSTRIL IN THE MORNING  1  . KLOR-CON M10 10 MEQ tablet Take 10 mEq by mouth 2 (two) times daily.  5  . NITROSTAT 0.4 MG SL tablet PLACE ONE TABLET (0.4 MG TOTAL) UNDER THE TONGUE EVERY 5 (FIVE) MINUTES AS NEEDED FOR CHEST PAIN.  3  . sildenafil (VIAGRA) 100 MG tablet Take 100 mg by mouth as needed.    . citalopram (CELEXA) 20 MG tablet Take 20 mg by mouth daily.  3  . levETIRAcetam (KEPPRA) 500 MG tablet Take 1 tablet (500 mg total) by mouth 2 (two) times daily. (Patient not taking: Reported on 01/01/2016) 60 tablet 6  . lisinopril (PRINIVIL,ZESTRIL) 10 MG tablet Take 10 mg by mouth daily.  1   No current facility-administered medications for this visit.    Allergies as of 01/01/2016 - Review Complete 10/01/2015  Allergen Reaction Noted  . Codeine Other (See Comments) 09/19/2015    Vitals: BP 162/78 mmHg  Pulse 57  Wt 178 lb 9.6 oz (81.012 kg)  SpO2 98% Last Weight:  Wt Readings from Last 1 Encounters:  01/01/16 178 lb 9.6 oz (81.012 kg)   Last Height:   Ht Readings from Last 1 Encounters:  10/01/15 5\' 9"  (1.753 m)   MS The patient is oriented to person, place, and time;   recent and remote memory intact;   language fluent;   normal attention, concentration,   fund of knowledge Cranial Nerves:  The pupils are equal, round, and reactive to light. The fundi are normal and spontaneous venous pulsations are present. Visual fields are full to finger confrontation. Extraocular movements are intact. Trigeminal sensation is intact and the muscles of mastication are normal. The face is symmetric. The palate elevates in the midline. Hearing intact. Voice is normal. Shoulder shrug is normal. The tongue has normal motion without fasciculations.   Coordination:  Normal finger to nose and heel to shin. Normal rapid alternating movements.   Gait:  Heel-toe and tandem gait are  normal.   Motor Observation:  No asymmetry, no atrophy, and no involuntary movements noted. Tone:  Normal muscle tone.   Posture:  Posture is normal. normal erect   Strength:  Strength is V/V in the upper and lower limbs.    Sensation: intact to LT   Reflex Exam:  DTR's:  Deep tendon reflexes in the upper and lower extremities are normal bilaterally.  Toes:  The toes are downgoing bilaterally.  Clonus:  Clonus is absent.     Assessment/Plan: 70 year old gentleman with an episode of transient alteration of awareness. Episode more c/w transient global amnesia however cannot rule out partial seizure especially given SDH from fall 2 weeks earlier. Advised if this is TGA there  is no need to refrain from driving, recurrence is very low. EEG negative and CT of the head with improving SDH, no more events. Feel this was TGA and not seizure. Cleared ot drive. Not on Keppra. Follow up as needed.   CC: Stanton Kidney  Assessment/Plan:    Naomie Dean, MD  [facility] [address] [place], [place] [address]  Phone [phone] Fax [phone]  A total of 30 minutes was spent face-to-face with this patient. Over half this time was spent on counseling patient on the Transient global diagnosis and different diagnostic and therapeutic options available.

## 2016-12-26 IMAGING — CT CT CERVICAL SPINE W/O CM
3 of 4 series · 13 of 33 positions shown, 16 images · non-contrast
Comparison: Head CT 09/19/2015.

CLINICAL DATA: Fall today with posterior neck stiffness. Amnesia.
Initial encounter.

EXAM:
CT CERVICAL SPINE WITHOUT CONTRAST
TECHNIQUE: Multidetector CT imaging of the cervical spine was performed without
intravenous contrast. Multiplanar CT image reconstructions were also
generated.

[Series 3: c_spine 2.0 i30s 3 · axial · 0.35mm/px · z∈[-356,-216]mm · 5 of 106 slices shown, 7 images]
[im 18/106  soft-tissue]
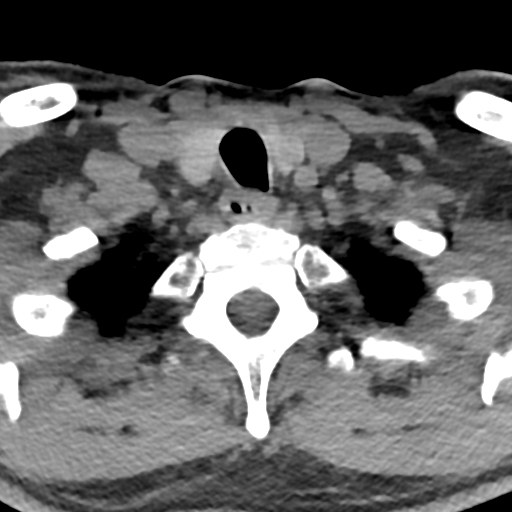
[im 18/106  bone]
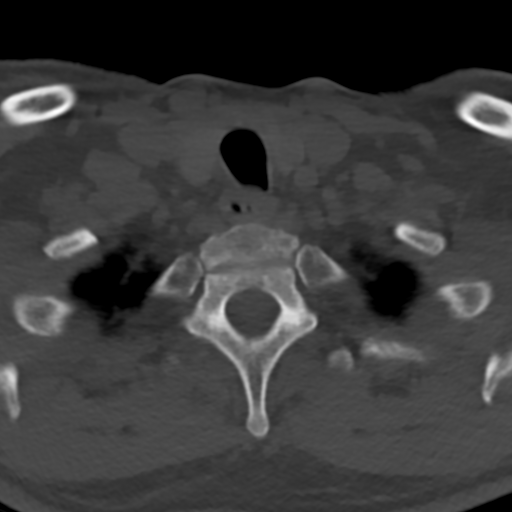
[im 36/106  bone]
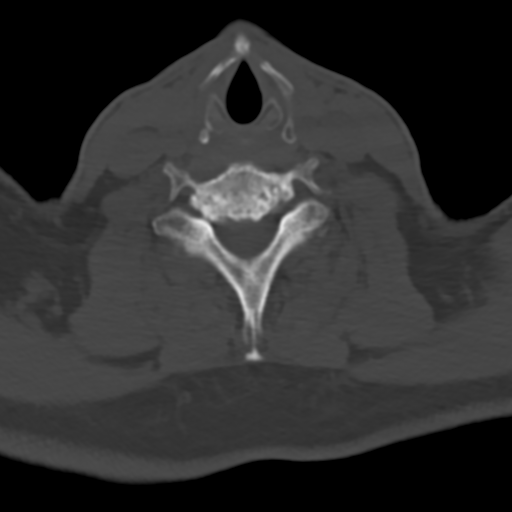
[im 53/106  bone]
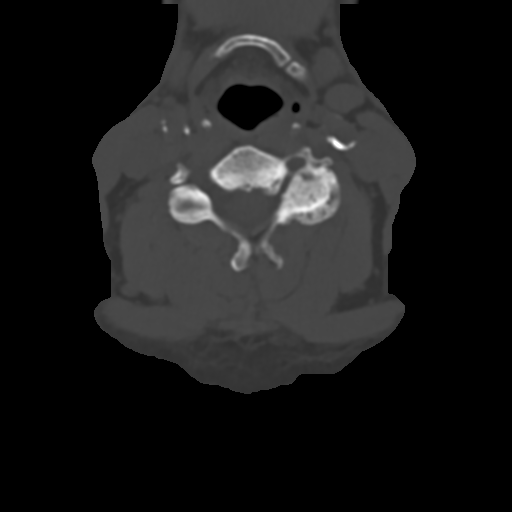
[im 71/106  bone]
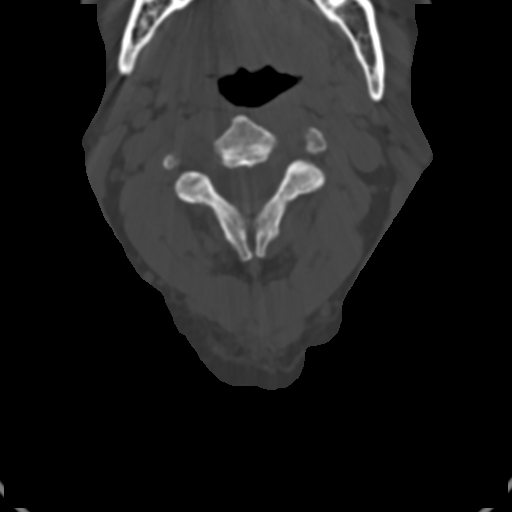
[im 88/106  soft-tissue]
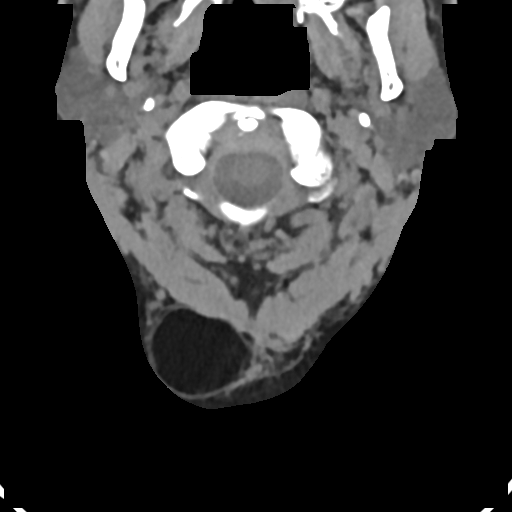
[im 88/106  bone]
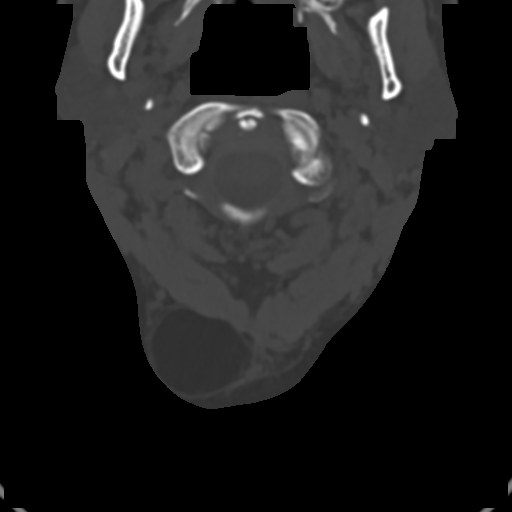

[Series 5: coronal bone · coronal · 0.31mm/px · 3 of 67 slices shown]
[im 14/67  bone]
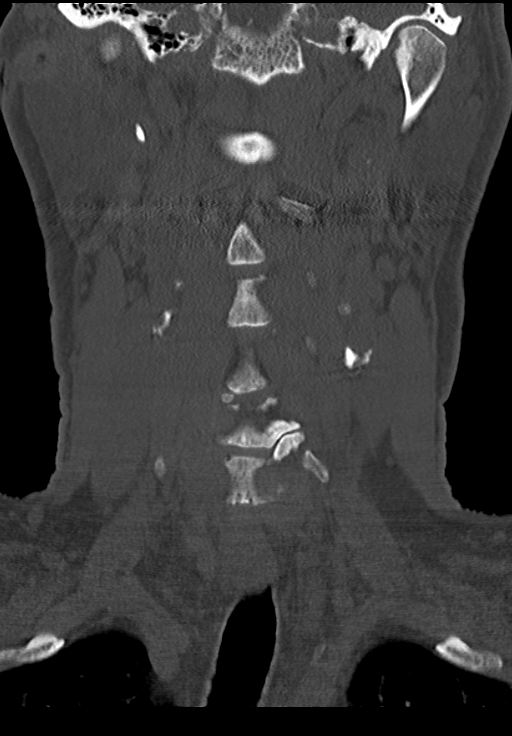
[im 27/67  bone]
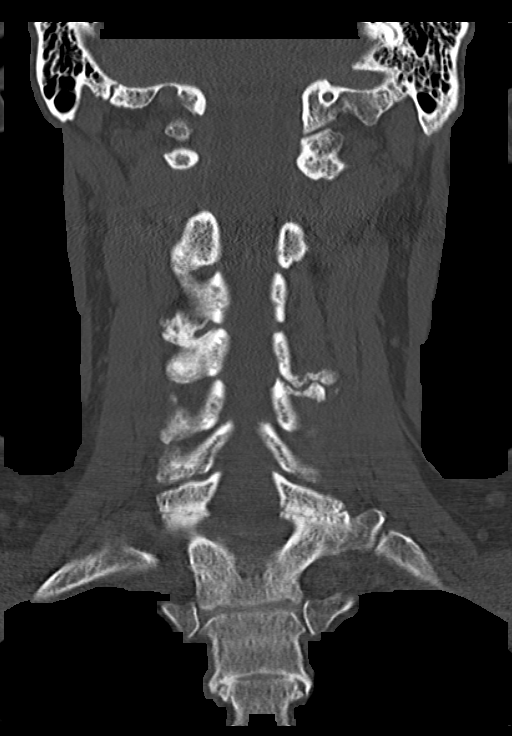
[im 40/67  bone]
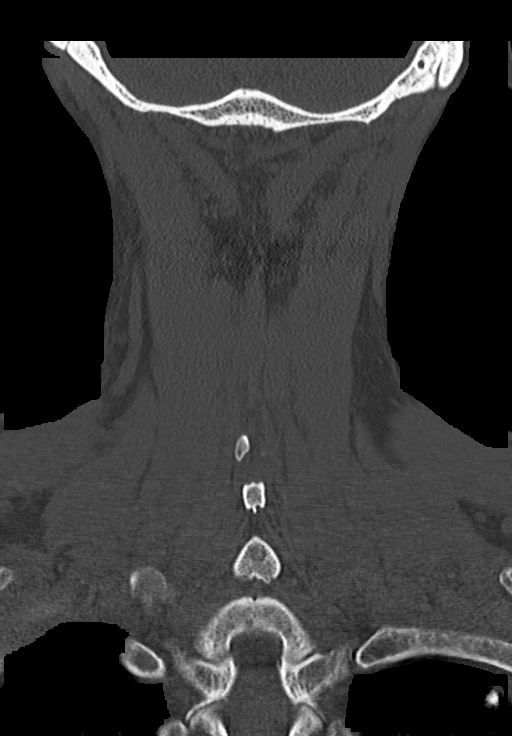

[Series 6: sagittal bone · sagittal · 0.35mm/px · 5 of 54 slices shown, 6 images]
[im 18/54  bone]
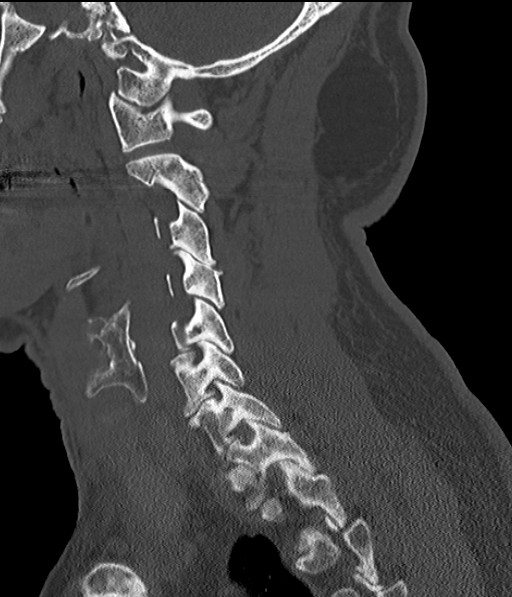
[im 23/54  bone]
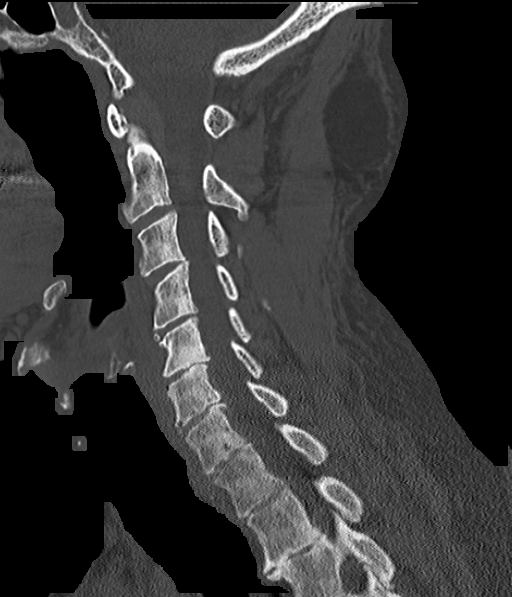
[im 27/54  soft-tissue]
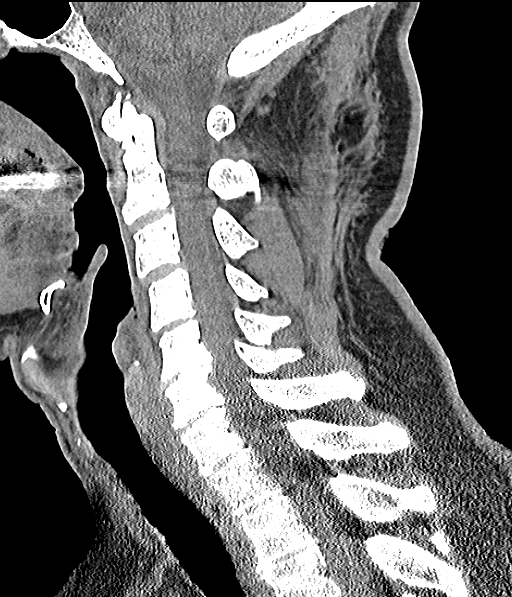
[im 27/54  bone]
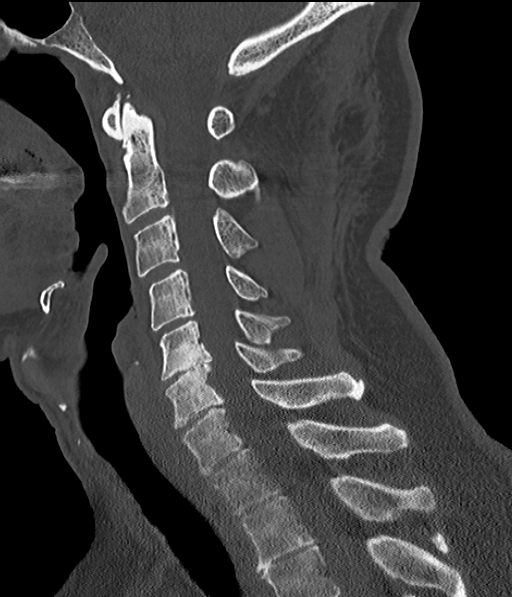
[im 31/54  bone]
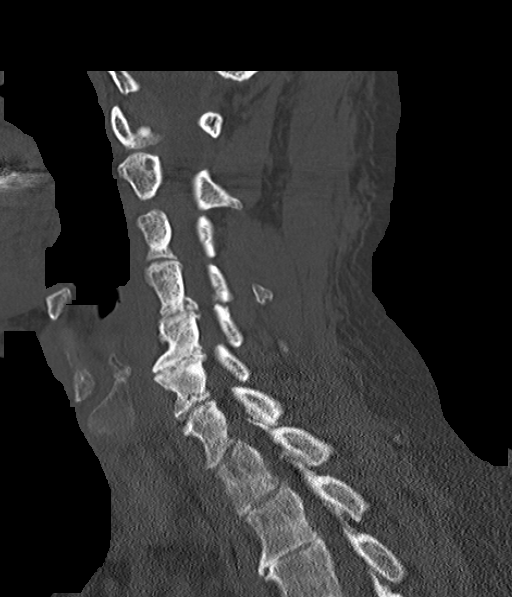
[im 36/54  bone]
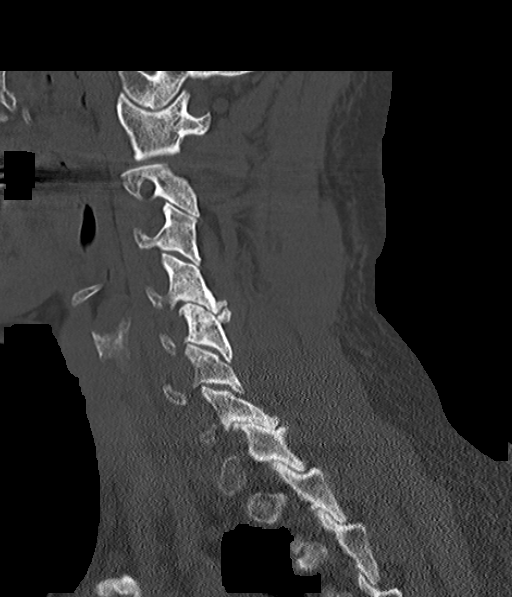

[13 of 33 positions shown; findings below may reference images not displayed]

FINDINGS: The cervical alignment is normal. There is no evidence of acute
fracture or traumatic subluxation.

There is multilevel spondylosis with disc space loss and uncinate
spurring greatest at C5-6 and C6-7. There is mild resulting
foraminal narrowing.

No acute soft tissue findings are demonstrated within the neck.
There is a subcutaneous lipoma posteriorly in the upper right neck,
measuring up to 4 cm transverse. Carotid calcifications are present
bilaterally. There is a calcified granuloma at the left lung apex.
IMPRESSION: 1. No evidence of acute cervical spine fracture, traumatic
subluxation or static signs of instability.
2. Cervical spondylosis.
3. Subcutaneous lipoma posteriorly in the upper right neck.

## 2016-12-26 IMAGING — CT CT HEAD W/O CM
2 series · 16 of 30 positions shown, 20 images · non-contrast
Comparison: None.

CLINICAL DATA: Recent fall and altered mental status

EXAM:
CT HEAD WITHOUT CONTRAST
TECHNIQUE: Contiguous axial images were obtained from the base of the skull
through the vertex without intravenous contrast.

[Series 201: head w/o, idose (1) · axial · non-contrast · 0.46mm/px · z∈[+83,+213]mm · 13 of 32 slices shown, 17 images]
[im 3/32  brain]
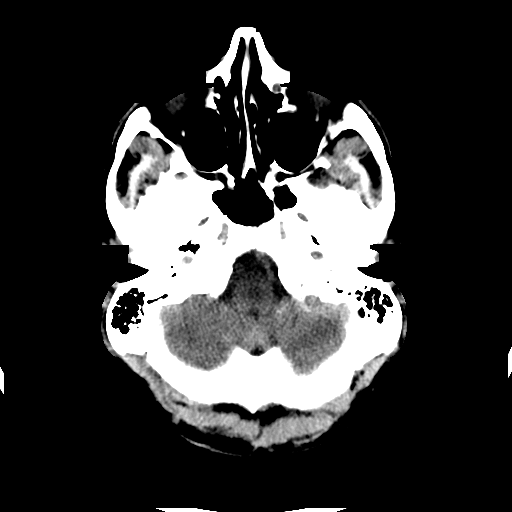
[im 3/32  bone]
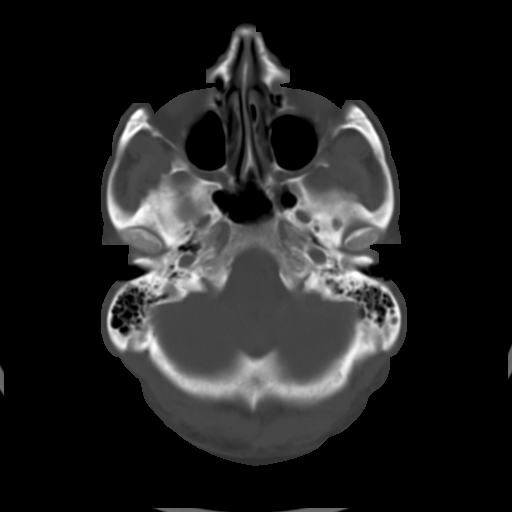
[im 5/32  brain]
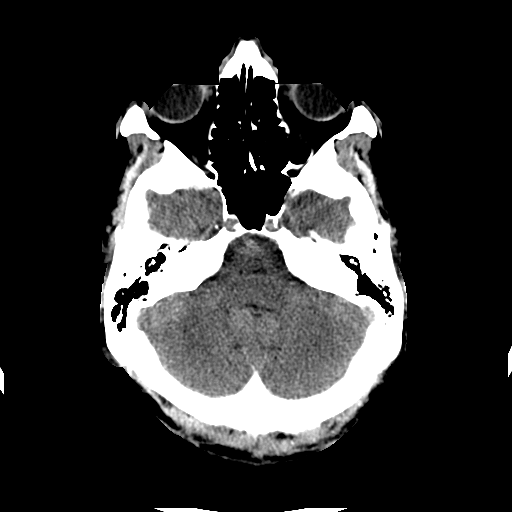
[im 7/32  brain]
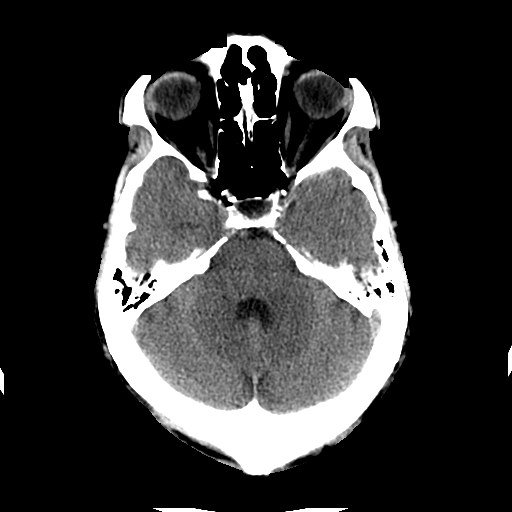
[im 9/32  brain]
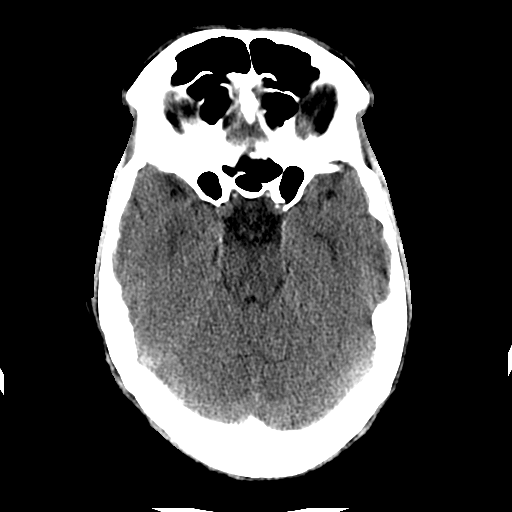
[im 12/32  brain]
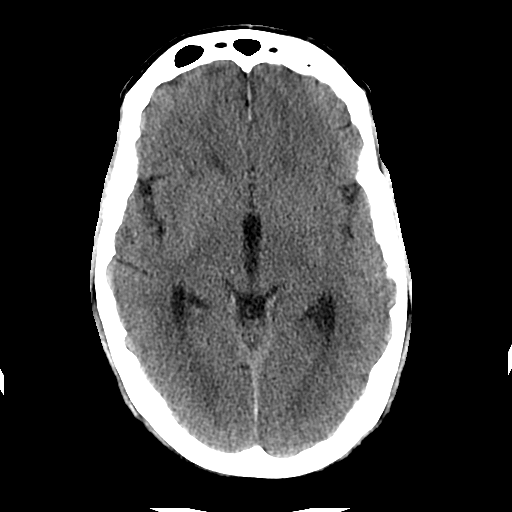
[im 12/32  bone]
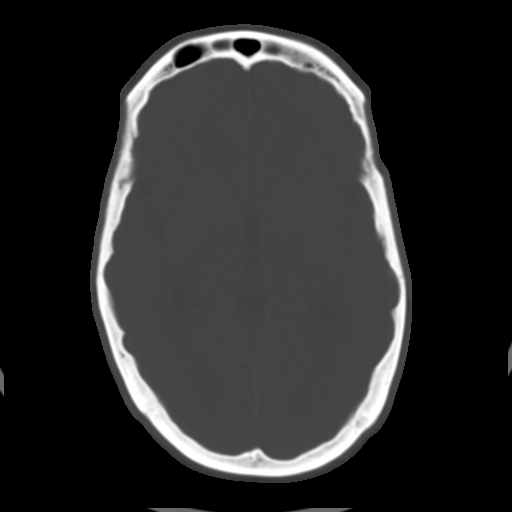
[im 14/32  brain]
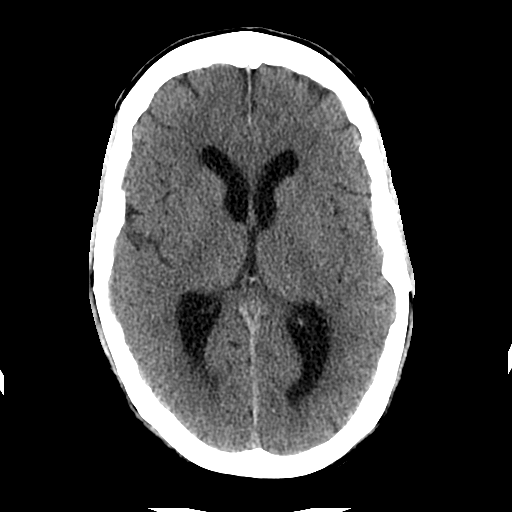
[im 16/32  brain]
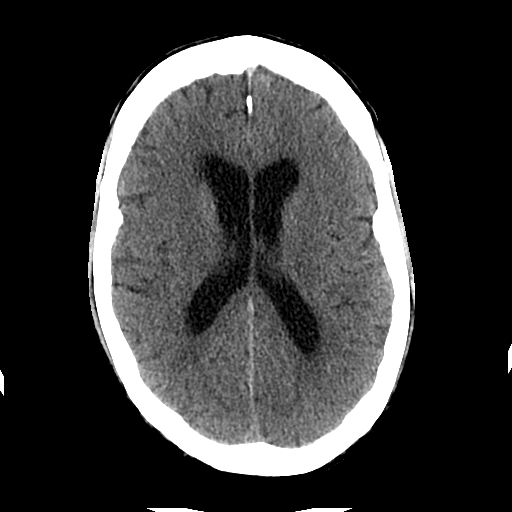
[im 18/32  brain]
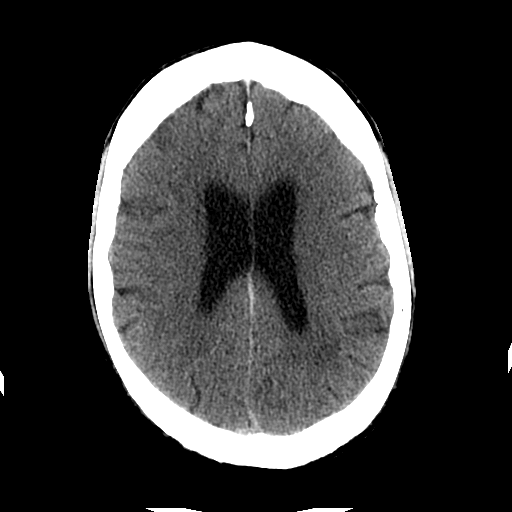
[im 20/32  brain]
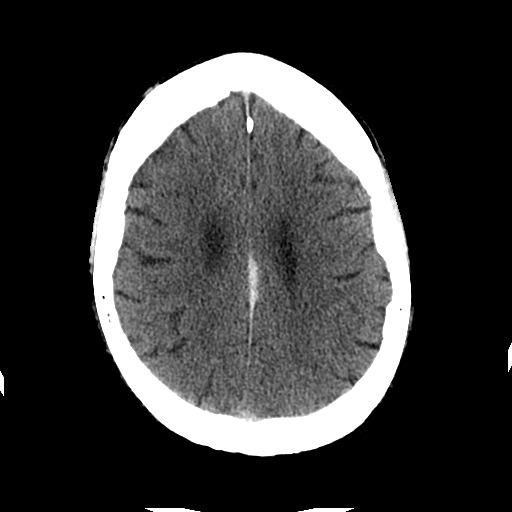
[im 20/32  bone]
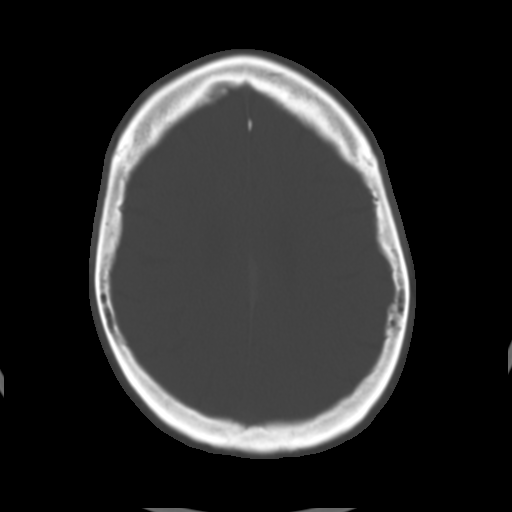
[im 23/32  brain]
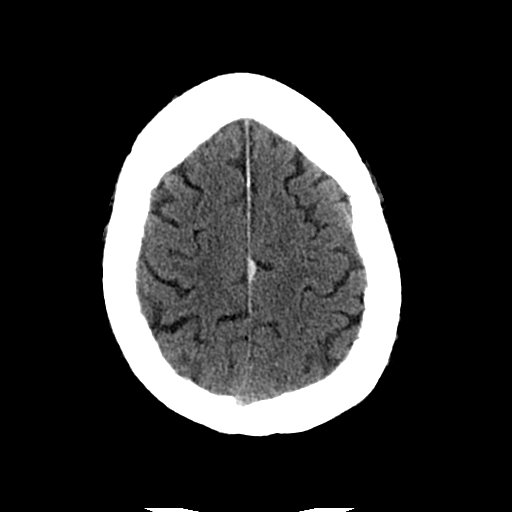
[im 25/32  brain]
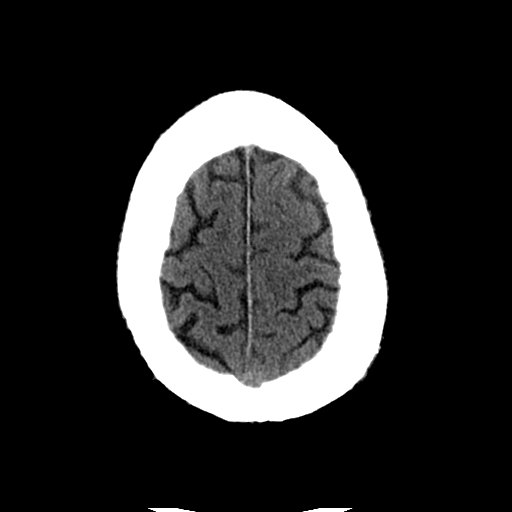
[im 27/32  brain]
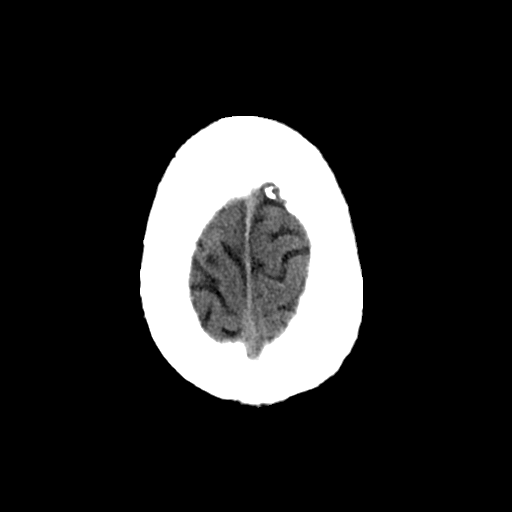
[im 29/32  brain]
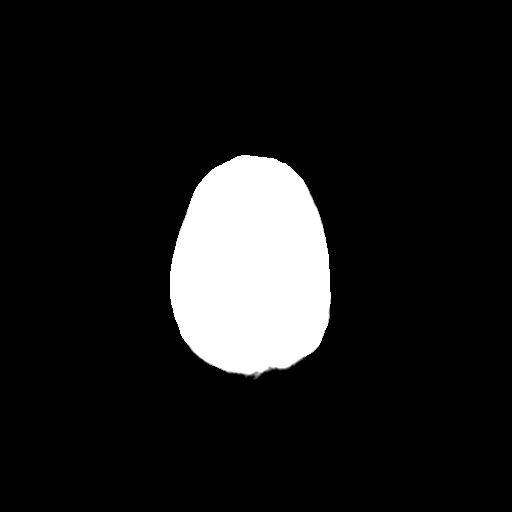
[im 29/32  bone]
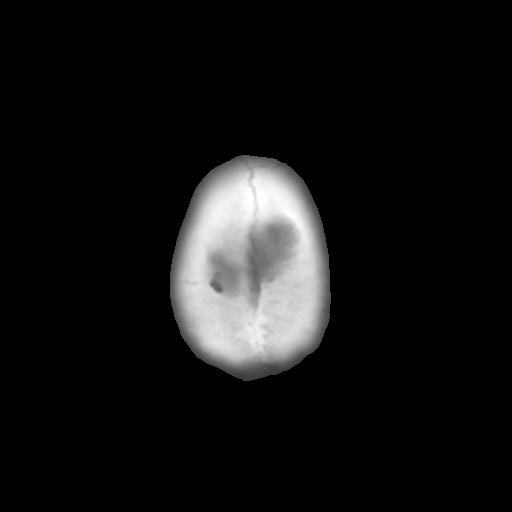

[Series 202: head w/o bone, idose (1) · axial · non-contrast · 0.46mm/px · z∈[+83,+128]mm · 3 of 32 slices shown]
[im 3/32  bone]
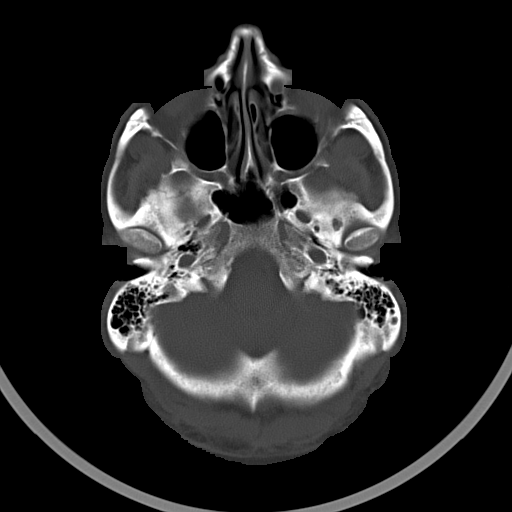
[im 7/32  bone]
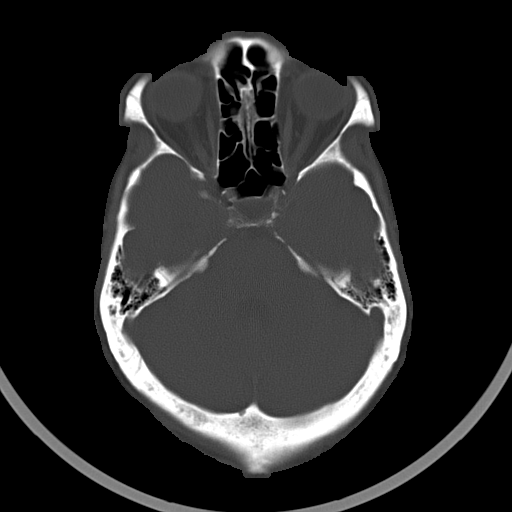
[im 12/32  bone]
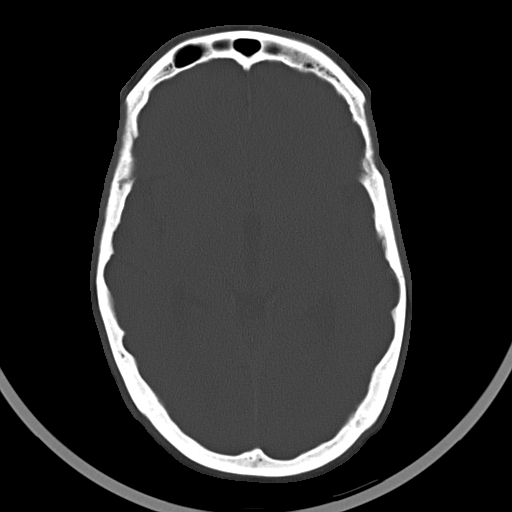

[16 of 30 positions shown; findings below may reference images not displayed]

FINDINGS: Bony calvarium is intact. Mild thickening of the falx is noted. This
would be consistent with a small falx subdural hematoma. No
subarachnoid hemorrhage is identified at this time. No acute infarct
is seen.
IMPRESSION: Mild subdural hematoma along the falx cerebri predominately to the
left of the midline. No significant mass-effect is seen. No other
focal abnormality is noted.

Critical Value/emergent results were called by telephone at the time
of interpretation on 09/19/2015 at [DATE] to Dr. RASMYTH GRACE ESBIETO ,
who verbally acknowledged these results.

## 2017-06-20 HISTORY — PX: HERNIA REPAIR: SHX51

## 2017-09-07 ENCOUNTER — Telehealth: Payer: Self-pay | Admitting: *Deleted

## 2017-09-07 NOTE — Telephone Encounter (Signed)
I have scheduled the pt with an apt on oct 29 at 8:30 am to discuss this further

## 2017-09-07 NOTE — Telephone Encounter (Signed)
Since June 2, with hernia surgery  The patient started having some jerky movements during the night. In the mornings his legs are more tense upon waking up.

## 2017-10-18 ENCOUNTER — Encounter: Payer: Self-pay | Admitting: Neurology

## 2017-10-18 ENCOUNTER — Ambulatory Visit (INDEPENDENT_AMBULATORY_CARE_PROVIDER_SITE_OTHER): Payer: Medicare Other | Admitting: Neurology

## 2017-10-18 VITALS — BP 141/81 | HR 52 | Wt 179.6 lb

## 2017-10-18 DIAGNOSIS — G4761 Periodic limb movement disorder: Secondary | ICD-10-CM | POA: Diagnosis not present

## 2017-10-18 NOTE — Patient Instructions (Addendum)
Periodic limb movement disorder (PLMD) is a condition that was formerly called sleep myoclonus or nocturnal myoclonus. It is described as repetitive limb movements that occur during sleep and cause sleep disruption    Essential Tremor A tremor is trembling or shaking that you cannot control. Most tremors affect the hands or arms. Tremors can also affect the head, vocal cords, face, and other parts of the body. Essential tremor is a tremor without a known cause. What are the causes? Essential tremor has no known cause. What increases the risk? You may be at greater risk of essential tremor if:  You have a family member with essential tremor.  You are age 71 or older.  You take certain medicines.  What are the signs or symptoms? The main sign of a tremor is uncontrolled and unintentional rhythmic shaking of a body part.  You may have difficulty eating with a spoon or fork.  You may have difficulty writing.  You may nod your head up and down or side to side.  You may have a quivering voice.  Your tremors:  May get worse over time.  May come and go.  May be more noticeable on one side of your body.  May get worse due to stress, fatigue, caffeine, and extreme heat or cold.  How is this diagnosed? Your health care provider can diagnose essential tremor based on your symptoms, medical history, and a physical examination. There is no single test to diagnose an essential tremor. However, your health care provider may perform a variety of tests to rule out other conditions. Tests may include:  Blood and urine tests.  Imaging studies of your brain, such as: ? CT scan. ? MRI.  A test that measures involuntary muscle movement (electromyogram).  How is this treated? Your tremors may go away without treatment. Mild tremors may not need treatment if they do not affect your day-to-day life. Severe tremors may need to be treated using one or a combination of the following  options:  Medicines. This may include medicine that is injected.  Lifestyle changes.  Physical therapy.  Follow these instructions at home:  Take medicines only as directed by your health care provider.  Limit alcohol intake to no more than 1 drink per day for nonpregnant women and 2 drinks per day for men. One drink equals 12 oz of beer, 5 oz of wine, or 1 oz of hard liquor.  Do not use any tobacco products, including cigarettes, chewing tobacco, or electronic cigarettes. If you need help quitting, ask your health care provider.  Take medicines only as directed by your health care provider.  Avoid extreme heat or cold.  Limit the amount of caffeine you consumeas directed by your health care provider.  Try to get eight hours of sleep each night.  Find ways to manage your stress, such as meditation or yoga.  Keep all follow-up visits as directed by your health care provider. This is important. This includes any physical therapy visits. Contact a health care provider if:  You experience any changes in the location or intensity of your tremors.  You start having a tremor after starting a new medicine.  You have tremor with other symptoms such as: ? Numbness. ? Tingling. ? Pain. ? Weakness.  Your tremor gets worse.  Your tremor interferes with your daily life. This information is not intended to replace advice given to you by your health care provider. Make sure you discuss any questions you have with your health  care provider. Document Released: 12/28/2014 Document Revised: 05/14/2016 Document Reviewed: 06/04/2014 Elsevier Interactive Patient Education  [employer]2018 [employer]

## 2017-10-18 NOTE — Progress Notes (Signed)
[facility]    Provider:  Dr Lucia GaskinsAhern Referring Provider: [name], [name], MD Primary Care Physician:  [name], [name], MD   CC: PLMS and RLS  He had surgery in July and since then he would twitch during sleep. Looked like his abdomen, regular movements every 5-10 seconds, went on every night and then all of a sudden it stopped. No movements for 10 days. She would wake him up and he was fine, no confusion, no alteration of awareness, wouldn't wake him up. He has neck pain and radiculopathy. He has a tremor of the left hand. He also has postural tremors. He is very tired in the morning when he wakes up. He snores sometimes. 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   Interval history 01/04/2016:  Not on Keppra. No events. 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 Not likely to happen again. Cleared to drive. No AEDs needed.  HPI: Dennis Short is a 71 y.o. male with Past medical history of coronary artery disease( PCI June 2016 [facility] [place] , essential hypertension, dyslipidemia, thrombocytopenia who [facility] with confusion after a fall. Per a review of records, he was confused and disoriented and found by EMS. Patient did not remember any events. He had a fall a week previous to the incident. A left SDH was found on imaging. He was continued on his dual platelet therapy for his recent stent. He is following with cardiology.. EEG was normal however it was thought he suffered a complex partial seizure vs TGA. He was started on Keppra. CBC and CMP unremarkable.   Here with his wife who provides most information of the incident. The last thing patient remembers he was in his car, he had left the rink, changed clothes and he remembers talking to his wife on the phone. He doesn't remember the 2 hours prior to that. Apparently he was skating and put on some hockey equipment and was shooting a puck and skating around. In the car,  his wife noticed he was repeating the same thing over and over again, the same question even though she would tell him. Wife says he was answering questions and she was answering but asking the same ones over and over again. Wife thought something was wrong, asked him to go back into the rink and ask people for help. EMS came. The rink emplotyees later said he was pacing for a while and he looked confused. No history of seizures personally or in the family. 2 weeks earlier he fell on the ice and his head just barely and he had a helmet on too. No confusion or headache or anything after the fall. He did not feel tired after the incident, he just couldn't recall anything during the time frame. In the ED he was completely fine.   Reviewed notes, labs and imaging from outside physicians, which showed:  Cbc with plts 121, cmp unremarkable  Personally reviewed MRI images and agree with following: Small LEFT parafalcine subdural hematoma without mass effect. Involutional changes. Mild to moderate white matter changes compatible with chronic small vessel ischemic disease.   Review of Systems: Patient complains of symptoms per HPI as well as the following symptoms: easy bruising, easy bleeding. Pertinent negatives per HPI. All others negative.   Social History   Social History  . Marital status: Married    Spouse name: Dennis GerlachLeigh Ann  . Number of children: 2  . Years of education: 12   Occupational History  .  Retired     Social History Main Topics  . Smoking status: Former Smoker    Quit date: 12/21/1969  . Smokeless tobacco: Never Used  . Alcohol use 0.0 oz/week     Comment: occass  . Drug use: No  . Sexual activity: Not on file   Other Topics Concern  . Not on file   Social History Narrative   Lives at home with leigh ann Short.   Caffeine: Drinks coffee daily    Family History  Problem Relation Age of Onset  . Seizures Neg Hx     Past Medical History:  Diagnosis Date  .  Heart disease   . High cholesterol   . Hypertension     Past Surgical History:  Procedure Laterality Date  . CORONARY STENT PLACEMENT    . HERNIA REPAIR  06/2017    Current Outpatient Prescriptions  Medication Sig Dispense Refill  . aspirin EC 81 MG tablet Take 81 mg by mouth daily.    Marland Kitchen atorvastatin (LIPITOR) 40 MG tablet Take 40 mg by mouth daily.  3  . citalopram (CELEXA) 20 MG tablet Take 20 mg by mouth daily.  3  . doxazosin (CARDURA) 4 MG tablet Take 4 mg by mouth 2 (two) times daily.  5  . fluticasone (FLONASE) 50 MCG/ACT nasal spray 2 SPRAYS EACH NOSTRIL IN THE MORNING  1  . lisinopril (PRINIVIL,ZESTRIL) 10 MG tablet Take 10 mg by mouth daily.  1  . metoprolol tartrate (LOPRESSOR) 25 MG tablet Take 25 mg by mouth daily.    Marland Kitchen NITROSTAT 0.4 MG SL tablet PLACE ONE TABLET (0.4 MG TOTAL) UNDER THE TONGUE EVERY 5 (FIVE) MINUTES AS NEEDED FOR CHEST PAIN.  3  . sildenafil (VIAGRA) 100 MG tablet Take 100 mg by mouth as needed.     No current facility-administered medications for this visit.     Allergies as of 10/18/2017 - Review Complete 10/18/2017  Allergen Reaction Noted  . Codeine Other (See Comments) 09/19/2015    Vitals: BP (!) 141/81   Pulse (!) 52   Wt 179 lb 9.6 oz (81.5 kg)   BMI 26.52 kg/m  Last Weight:  Wt Readings from Last 1 Encounters:  10/18/17 179 lb 9.6 oz (81.5 kg)   Last Height:   Ht Readings from Last 1 Encounters:  10/01/15 5\' 9"  (1.753 m)    Physical exam: Exam: Gen: NAD, conversant, well nourised, obese, well groomed                     Eyes: Conjunctivae clear without exudates or hemorrhage  Neuro: Detailed Neurologic Exam  Speech:    Speech is normal; fluent and spontaneous with normal comprehension.  Cognition:    The patient is oriented to person, place, and time;     recent and remote memory intact;     language fluent;     normal attention, concentration,     fund of knowledge Cranial Nerves:    The pupils are equal, round,  and reactive to light. The fundi are normal and spontaneous venous pulsations are present. Visual fields are full to finger confrontation. Extraocular movements are intact. Trigeminal sensation is intact and the muscles of mastication are normal. The face is symmetric. The palate elevates in the midline. Hearing intact. Voice is normal. Shoulder shrug is normal. The tongue has normal motion without fasciculations.   Coordination:    Normal finger to nose and heel to shin. Normal rapid alternating movements.  Gait:    Heel-toe and tandem gait are normal.   Motor Observation:    No asymmetry, no atrophy, and no involuntary movements noted. Tone:    Normal muscle tone.    Posture:    Posture is normal. normal erect    Strength:    Strength is V/V in the upper and lower limbs.      Sensation: intact to LT     Reflex Exam:  DTR's:    Deep tendon reflexes in the upper and lower extremities are normal bilaterally.   Toes:    The toes are downgoing bilaterally.   Clonus:    Clonus is absent.     Assessment/Plan: 71 year old gentleman with an episode of transient alteration of awareness. Episode more c/w transient global amnesia however cannot rule out partial seizure especially given SDH from fall 2 weeks earlier. Advised if this is TGA there is no need to refrain from driving, recurrence is very low. EEG negative and CT of the head with improving SDH, no more events. Feel this was TGA and not seizure. Cleared ot drive. Not on Keppra.   Here for new symptoms including what is likely periodic limb movements of sleep and essential tremor. Discussed he should have his wife videotape his movements at night. If they get worse I do recommend MRI of the brain and an evaluation with our sleep doctor for overnight sleep monitoring. He was to hold off on these right now, told him we can monitor him clinically but if symptoms persist or worsen he needs to contact me so we can set him up for  follow-up testing.   CC: Darrol Angel, MD  [facility] [address] [place], [place] [address]  Phone [phone] Fax [phone]  A total of 25 minutes was spent face-to-face with this patient. Over half this time was spent on counseling patient on the ET, PLMS diagnosis and different diagnostic and therapeutic options available.
